# Patient Record
Sex: Female | Born: 1970 | Race: White | Hispanic: No | Marital: Married | Smoking: Current every day smoker
Health system: Southern US, Community
[De-identification: ages and names within clinical notes are randomized; demographics above are authoritative.]

## PROBLEM LIST (undated history)

## (undated) DIAGNOSIS — M5136 Other intervertebral disc degeneration, lumbar region: Secondary | ICD-10-CM

## (undated) DIAGNOSIS — M51369 Other intervertebral disc degeneration, lumbar region without mention of lumbar back pain or lower extremity pain: Secondary | ICD-10-CM

## (undated) DIAGNOSIS — M797 Fibromyalgia: Secondary | ICD-10-CM

## (undated) DIAGNOSIS — J449 Chronic obstructive pulmonary disease, unspecified: Secondary | ICD-10-CM

## (undated) DIAGNOSIS — E119 Type 2 diabetes mellitus without complications: Secondary | ICD-10-CM

## (undated) DIAGNOSIS — J45909 Unspecified asthma, uncomplicated: Secondary | ICD-10-CM

## (undated) DIAGNOSIS — M5126 Other intervertebral disc displacement, lumbar region: Secondary | ICD-10-CM

## (undated) DIAGNOSIS — G43909 Migraine, unspecified, not intractable, without status migrainosus: Secondary | ICD-10-CM

## (undated) DIAGNOSIS — E785 Hyperlipidemia, unspecified: Secondary | ICD-10-CM

## (undated) HISTORY — DX: Other intervertebral disc degeneration, lumbar region without mention of lumbar back pain or lower extremity pain: M51.369

## (undated) HISTORY — DX: Type 2 diabetes mellitus without complications: E11.9

## (undated) HISTORY — DX: Other intervertebral disc degeneration, lumbar region: M51.36

## (undated) HISTORY — DX: Hyperlipidemia, unspecified: E78.5

## (undated) HISTORY — DX: Other intervertebral disc displacement, lumbar region: M51.26

## (undated) HISTORY — DX: Chronic obstructive pulmonary disease, unspecified: J44.9

## (undated) HISTORY — DX: Migraine, unspecified, not intractable, without status migrainosus: G43.909

## (undated) HISTORY — DX: Fibromyalgia: M79.7

## (undated) HISTORY — PX: APPENDECTOMY: SHX54

## (undated) HISTORY — DX: Unspecified asthma, uncomplicated: J45.909

---

## 2008-03-22 ENCOUNTER — Ambulatory Visit (HOSPITAL_COMMUNITY): Admission: RE | Admit: 2008-03-22 | Discharge: 2008-03-22 | Payer: Self-pay | Admitting: Family Medicine

## 2008-05-06 ENCOUNTER — Ambulatory Visit (HOSPITAL_COMMUNITY): Admission: RE | Admit: 2008-05-06 | Discharge: 2008-05-06 | Payer: Self-pay | Admitting: Family Medicine

## 2008-07-28 ENCOUNTER — Ambulatory Visit (HOSPITAL_COMMUNITY): Admission: RE | Admit: 2008-07-28 | Discharge: 2008-07-28 | Payer: Self-pay | Admitting: Internal Medicine

## 2009-01-06 ENCOUNTER — Encounter
Admission: RE | Admit: 2009-01-06 | Discharge: 2009-04-06 | Payer: Self-pay | Admitting: Physical Medicine & Rehabilitation

## 2009-01-12 ENCOUNTER — Ambulatory Visit: Payer: Self-pay | Admitting: Physical Medicine & Rehabilitation

## 2009-02-06 ENCOUNTER — Ambulatory Visit: Payer: Self-pay | Admitting: Physical Medicine & Rehabilitation

## 2009-03-13 ENCOUNTER — Ambulatory Visit: Payer: Self-pay | Admitting: Physical Medicine & Rehabilitation

## 2010-03-22 ENCOUNTER — Other Ambulatory Visit: Admission: RE | Admit: 2010-03-22 | Discharge: 2010-03-22 | Payer: Self-pay | Admitting: Obstetrics and Gynecology

## 2010-03-26 ENCOUNTER — Ambulatory Visit (HOSPITAL_COMMUNITY): Admission: RE | Admit: 2010-03-26 | Discharge: 2010-03-26 | Payer: Self-pay | Admitting: Obstetrics and Gynecology

## 2010-05-15 ENCOUNTER — Ambulatory Visit (HOSPITAL_COMMUNITY): Admission: RE | Admit: 2010-05-15 | Discharge: 2010-05-15 | Payer: Self-pay | Admitting: Family Medicine

## 2010-08-05 ENCOUNTER — Encounter: Payer: Self-pay | Admitting: Family Medicine

## 2010-11-27 NOTE — Assessment & Plan Note (Signed)
The patient is a 40 year old female who originally had pain in her right  paraspinal thoracic area in July 2008.  She saw a chiropractor for this.  She was told that she tore a muscle and was treated with passive  modalities.  She improved and then worsened again in the fall of 2009.  She has had some increased pain since that time.  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  She had a thoracic spine MRI on July 28, 2008,  showing no significant diskogenic disease.  Paraspinal soft tissues were  normal.  No spinal cord abnormalities.  She was evaluated by  Neurosurgery over at [facility] and she was not felt to have any type of  surgical lesion.  I have also reviewed some lab work including a CBC  with diff, comprehensive metabolic panel and lipid profile, all of which  were fairly unremarkable.  TSH was also unremarkable.  She was sent  through some physical therapy.  At this time, it was more of an active  program doing some back extensor strengthening.   She has been treated with variety of medications, most recently  hydrocodone.  She states she takes this sparingly.  This was filled on  Dec 08, 2008, for q.i.d. and still has about 28 left.  She takes 3 a day  rather than 4 a day and also has Valium 5 mg 1-2 p.o. t.i.d., but this  was filled on May 5 and still has 2 left, so she really just taking this  once a day.   PAST SURGICAL HISTORY:  Positive for appendectomy.   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   Her average pain is 5/10, currently 7, described as sharp, constant,  aching.  Interferes with activity at 7/10, states she cannot play with  her kids much.  She can walk 50 minutes at times.  She climbs steps.  She drives.  Her pain is sharp, constant, aching.  She states she has  some numbness and tingling, but really this is very fleeting in her  proximal thighs rather than in  her legs.  She has had no incontinence,  but her bowel and bladder has had some diarrhea, as well as some nausea.  She complains of low energy.   SOCIAL HISTORY:  Married, smokes half pack per day.  No drug or alcohol  abuse in the past.   FAMILY HISTORY:  Diabetes, high blood pressure, psychiatric problems and  disability.   Her blood pressure is 107/68, pulse 83, respirations 18, and O2 sat 99%  on room air.  Overweight female in no acute stress.  Orientation x3.  Affect is alert.  She is anxious as well as emotionally labile, tends to  cry.  Her gait is normal.  Her extremities without edema.  Coordination  is normal in the upper and lower extremity.  Deep tendon reflexes are  normal in the upper and lower extremity.  Sensation is normal in the  upper and lower extremity.  Range of motion is normal in the upper and  lower extremity.  Palpation of her fibromyalgia tender points is only  positive in the low back area.  She has more focal tenderness around T8  and 9 area on the right approximately 2 to 3 cm lateral of the spinous  process.   Motor strength is full in the upper  and lower extremities.   IMPRESSION:  Mid back pain.  I have reviewed differential with the  patient.  Certainly, I do not think it is diskogenic.  Other  alternatives include thoracic facet syndrome and myofascial pain  syndrome, which is most likely.  She also may have some altered pain  sensitivity bearing in mind her history of temporomandibular joint.  She  is also quite emotionally labile with this and is concerned about the  impact on her overall functionality.   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  For this reason, I will start  her on Cymbalta 30 mg a  day.  Given her samples and a prescription.  She will have to come back  for trigger point injections and trial of  Lidoderm patch.  Discussed  with the patient, agrees with plan.      Erick Colace, M.D.  Electronically Signed     AEK/MedQ  D:  01/12/2009 14:52:32  T:  01/13/2009 05:35:40  Job #:  [id number]   cc:   Dr. Phillips Odor

## 2010-11-27 NOTE — Assessment & Plan Note (Signed)
A 40 year old female, right paraspinal pain which had onset in 2008,  seen by chiropractic, MRI showing no significant spine-related  disorders, no paraspinal soft tissue masses were noted, seen by  neurosurgery, not felt to have a surgical lesion.  She has done quite  well on Cymbalta 60 mg a day.  She has responded well to trigger-point  injection February 06, 2009.  She has been starting to exercise again.  She  is less emotional per her report.  Pain level 3/10 on average compared  to 6/10 prior.  Pain interferes with activity at 7/10 compared to 8  prior.  Sleep remains fair.  She can walk 20-30 minutes at a time.  She  climbs steps.  She drives.  She needs help with household duties and  shopping.   Her review of systems positive for weakness and spasms.   Blood pressure 107/65, pulse 87, respirations 18, O2 sat 98% on room  air.  General, no acute stress.  Mood and affect appropriate.  Back has  some tenderness to palpation in the lower thoracic and the entire lumbar  paraspinal muscle groups.  This is only on the right side.  She has good  range of motion of lumbar spine.  She has normal strength in the lower  extremities.  Normal tone.  Normal sensation in deep tendon reflex.   IMPRESSION:  Lumbar myofascial pain syndrome.  In a broader context,  fibromyalgia syndrome.   PLAN:  1. We will repeat trigger point injections today.  2. She is to continue Cymbalta  3. She is encouraged activity including aquatic exercise.      Erick Colace, M.D.  Electronically Signed     AEK/MedQ  D:  03/13/2009 16:25:57  T:  03/14/2009 05:41:39  Job #:  [id number]

## 2010-11-27 NOTE — Procedures (Signed)
NAME:  Barbara Barber, Barbara Barber              ACCOUNT NO.:  [id number]   MEDICAL RECORD NO.:  [id number]          PATIENT TYPE:  REC   LOCATION:  [facility]                          FACILITY:  [facility]   PHYSICIAN:  Erick Colace, M.D.DATE OF BIRTH:  1970-08-07   DATE OF PROCEDURE:  DATE OF DISCHARGE:                               OPERATIVE REPORT   Trigger point injection in right T12, L1, L3, and L5.   INDICATIONS:  Thoracolumbar myofascial pain syndrome.   Informed consent obtained after describing the risks and benefits of the  procedure with the patient.  These include bleeding, bruising,  infection.  She elects to proceed and has given written consent.  The  patient was placed prone on exam table.  Area marked, prepped with  Betadine and alcohol, entered with 25-gauge 1-1/2-inch needle, 1 mL of  1% lidocaine injected into each of 5 sites.  The patient tolerated the  procedure well.  Post-injection instruction was given.      Erick Colace, M.D.  Electronically Signed     AEK/MEDQ  D:  03/13/2009 16:08:20  T:  03/14/2009 04:48:52  Job:  [id number]

## 2010-11-27 NOTE — Procedures (Signed)
NAME:  Barbara Barber, COLTRIN NO.:  [id number]   MEDICAL RECORD NO.:  [id number]          PATIENT TYPE:  REC   LOCATION:  [facility]                          FACILITY:  [facility]   PHYSICIAN:  Erick Colace, M.D.DATE OF BIRTH:  1970-12-12   DATE OF PROCEDURE:  02/06/2009  DATE OF DISCHARGE:                               OPERATIVE REPORT   This is a trigger point injection right T11, T12-L1.   INDICATION:  Thoracolumbar myofascial pain syndrome.   Informed consent obtained after describing risks and benefits of the  procedure with the patient.  These include bleeding, bruising,  infection.  She elects to proceed and has given written consent.  The  patient placed prone on exam table.  Area marked and prepped with  Betadine alcohol entered with a 25-gauge inch and half needle, 1 mL of  1% lidocaine injected into each of 3 sites.  The patient tolerated the  procedure well.  Post injection instructions given.      Erick Colace, M.D.  Electronically Signed     AEK/MEDQ  D:  02/06/2009 15:42:21  T:  02/07/2009 04:19:16  Job:  [id number]

## 2014-01-20 ENCOUNTER — Other Ambulatory Visit: Payer: Self-pay | Admitting: Obstetrics and Gynecology

## 2014-01-27 ENCOUNTER — Ambulatory Visit (INDEPENDENT_AMBULATORY_CARE_PROVIDER_SITE_OTHER): Payer: BC Managed Care – PPO | Admitting: Obstetrics and Gynecology

## 2014-01-27 ENCOUNTER — Encounter: Payer: Self-pay | Admitting: Obstetrics and Gynecology

## 2014-01-27 ENCOUNTER — Other Ambulatory Visit (HOSPITAL_COMMUNITY)
Admission: RE | Admit: 2014-01-27 | Discharge: 2014-01-27 | Disposition: A | Discharge status: Home / Self Care | Payer: BC Managed Care – PPO | Source: Ambulatory Visit | Attending: Obstetrics and Gynecology | Admitting: Obstetrics and Gynecology

## 2014-01-27 VITALS — BP 108/62 | Ht 64.5 in | Wt 232.0 lb

## 2014-01-27 DIAGNOSIS — Z01419 Encounter for gynecological examination (general) (routine) without abnormal findings: Secondary | ICD-10-CM | POA: Insufficient documentation

## 2014-01-27 DIAGNOSIS — Z124 Encounter for screening for malignant neoplasm of cervix: Secondary | ICD-10-CM | POA: Insufficient documentation

## 2014-01-27 DIAGNOSIS — Z1151 Encounter for screening for human papillomavirus (HPV): Secondary | ICD-10-CM | POA: Insufficient documentation

## 2014-01-27 NOTE — Progress Notes (Signed)
This chart was scribed by Chestine SporeSoijett Blue , Medical Scribe, for Dr. Christin BachJohn Brooklyn Jeff on 01/27/14 at 3:50 PM. This chart was reviewed by Dr. Christin BachJohn Tomeko Scoville for accuracy.   Assessment:  Annual Gyn Exam   Plan:  1. pap smear done, next pap due in 3 years. 2. return annually or prn 3    Annual mammogram advised Subjective:  Barbara Barber is a 43 y.o. female No obstetric history on file. who presents for annual exam. Patient's last menstrual period was 01/21/2014. The patient has complaints today of heavy menstrual periods with associated pain to the back and right abdomen/right ovarian region. Cycle is q 28 days, lasting 5-8 days, with first 3 lite , 2-3 heavy, then lite. 1 box  X 48 tampon.  Pt states that she had a normal mammogram this January Dr. Emelda FearFerguson to know.   The following portions of the patient's history were reviewed and updated as appropriate: allergies, current medications, past family history, past medical history, past social history, past surgical history and problem list. Past Medical History  Diagnosis Date  . Fibromyalgia   . Diabetes mellitus without complication   . Hyperlipidemia   . Migraines     Past Surgical History  Procedure Laterality Date  . Appendectomy      Current outpatient prescriptions:DULoxetine (CYMBALTA) 60 MG capsule, Take 60 mg by mouth daily., Disp: , Rfl: ;  JANUMET 50-1000 MG per tablet, Take 1 tablet by mouth at bedtime. , Disp: , Rfl: ;  pravastatin (PRAVACHOL) 20 MG tablet, Take 10 mg by mouth daily. , Disp: , Rfl: ;  sitaGLIPtin-metformin (JANUMET) 50-500 MG per tablet, Take 1 tablet by mouth every morning., Disp: , Rfl: ;  tiZANidine (ZANAFLEX) 4 MG tablet, , Disp: , Rfl:   Review of Systems Constitutional: negative Gastrointestinal: negative, except for right lower abdominal pain Genitourinary: heavy menstrual periods   Objective:  BP 108/62  Ht 5' 4.5" (1.638 m)  Wt 232 lb (105.235 kg)  BMI 39.22 kg/m2  LMP 01/21/2014   BMI: Body  mass index is 39.22 kg/(m^2).  General Appearance: Alert, appropriate appearance for age. No acute distress HEENT: Grossly normal Neck / Thyroid:  Cardiovascular: RRR; normal S1, S2, no murmur Lungs: CTA bilaterally Back: No CVAT Breast Exam: not indicated  Gastrointestinal: Soft, non-tender, no masses or organomegaly Pelvic Exam: External genitalia: normal general appearance Vaginal: normal mucosa without prolapse or lesions, normal without tenderness, induration or masses and normal rugae Cervix: normal appearance Adnexa: normal bimanual exam Uterus: normal single, nontender and anteverted Rectovaginal: not indicated Lymphatic Exam: Non-palpable nodes in neck, clavicular, axillary, or inguinal regions Skin: no rash or abnormalities Neurologic: Normal gait and speech, no tremor  Psychiatric: Alert and oriented, appropriate affect.  Urinalysis:Not done  Christin BachJohn Hessie Varone. MD Pgr [phone] 3:53 PM

## 2014-01-27 NOTE — Patient Instructions (Signed)
Endometrial Ablation Endometrial ablation removes the lining of the uterus (endometrium). It is usually a same-day, outpatient treatment. Ablation helps avoid major surgery, such as surgery to remove the cervix and uterus (hysterectomy). After endometrial ablation, you will have little or no menstrual bleeding and may not be able to have children. However, if you are premenopausal, you will need to use a reliable method of birth control following the procedure because of the small chance that pregnancy can occur. There are different reasons to have this procedure, which include:  Heavy periods.  Bleeding that is causing anemia.  Irregular bleeding.  Bleeding fibroids on the lining inside the uterus if they are smaller than 3 centimeters. This procedure should not be done if:  You want children in the future.  You have severe cramps with your menstrual period.  You have precancerous or cancerous cells in your uterus.  You were recently pregnant.  You have gone through menopause.  You have had major surgery on the uterus, such as a cesarean delivery. LET YOUR HEALTH CARE PROVIDER KNOW ABOUT:  Any allergies you have.  All medicines you are taking, including vitamins, herbs, eye drops, creams, and over-the-counter medicines.  Previous problems you or members of your family have had with the use of anesthetics.  Any blood disorders you have.  Previous surgeries you have had.  Medical conditions you have. RISKS AND COMPLICATIONS  Generally, this is a safe procedure. However, as with any procedure, complications can occur. Possible complications include:  Perforation of the uterus.  Bleeding.  Infection of the uterus, bladder, or vagina.  Injury to surrounding organs.  An air bubble to the lung (air embolus).  Pregnancy following the procedure.  Failure of the procedure to help the problem, requiring hysterectomy.  Decreased ability to diagnose cancer in the lining of  the uterus. BEFORE THE PROCEDURE  The lining of the uterus must be tested to make sure there is no pre-cancerous or cancer cells present.  An ultrasound may be performed to look at the size of the uterus and to check for abnormalities.  Medicines may be given to thin the lining of the uterus. PROCEDURE  During the procedure, your health care provider will use a tool called a resectoscope to help see inside your uterus. There are different ways to remove the lining of your uterus.   Radiofrequency - This method uses a radiofrequency-alternating electric current to remove the lining of the uterus.  Cryotherapy - This method uses extreme cold to freeze the lining of the uterus.  Heated-Free Liquid - This method uses heated salt (saline) solution to remove the lining of the uterus.  Microwave - This method uses high-energy microwaves to heat up the lining of the uterus to remove it.  Thermal balloon - This method involves inserting a catheter with a balloon tip into the uterus. The balloon tip is filled with heated fluid to remove the lining of the uterus. AFTER THE PROCEDURE  After your procedure, do not have sexual intercourse or insert anything into your vagina until permitted by your health care provider. After the procedure, you may experience:  Cramps.  Vaginal discharge.  Frequent urination. Document Released: 05/10/2004 Document Revised: 03/03/2013 Document Reviewed: 12/02/2012 ExitCare Patient Information 2015 [employer]. This information is not intended to replace advice given to you by your health care provider. Make sure you discuss any questions you have with your health care provider.  

## 2014-01-31 LAB — CYTOLOGY - PAP

## 2014-02-09 ENCOUNTER — Telehealth: Payer: Self-pay | Admitting: Obstetrics and Gynecology

## 2014-02-10 NOTE — Telephone Encounter (Signed)
Pt aware of pap results.

## 2016-03-22 ENCOUNTER — Ambulatory Visit (INDEPENDENT_AMBULATORY_CARE_PROVIDER_SITE_OTHER): Payer: BC Managed Care – PPO | Admitting: Family Medicine

## 2016-03-22 ENCOUNTER — Encounter: Payer: Self-pay | Admitting: Family Medicine

## 2016-03-22 DIAGNOSIS — E119 Type 2 diabetes mellitus without complications: Secondary | ICD-10-CM

## 2016-03-22 DIAGNOSIS — M546 Pain in thoracic spine: Secondary | ICD-10-CM

## 2016-03-22 DIAGNOSIS — G8929 Other chronic pain: Secondary | ICD-10-CM

## 2016-03-22 DIAGNOSIS — M797 Fibromyalgia: Secondary | ICD-10-CM

## 2016-03-22 MED ORDER — GABAPENTIN 100 MG PO CAPS: 100.0000 mg | ORAL_CAPSULE | Freq: Three times a day (TID) | ORAL | 0 refills | Status: DC

## 2016-03-22 MED ORDER — SITAGLIPTIN PHOS-METFORMIN HCL 50-1000 MG PO TABS: 1.0000 | ORAL_TABLET | Freq: Two times a day (BID) | ORAL | 11 refills | Status: DC

## 2016-03-22 NOTE — Patient Instructions (Signed)
Great to meet you!  Lets follow up in one month to see how the medicine is helping.

## 2016-03-22 NOTE — Progress Notes (Signed)
   HPI  Patient presents today here to establish care with back pain.  Patient has a history of severe back injury years ago where she tore her large muscle in her back. She was treated with Vicodin, Valium, and Flexeril for a time and felt that it was too sedating so she stopped.  Recently she has had increased back pain is very frustrated with her current PCP because he has refused to give her narcotics for treatment. I have discussed that I agree with him that we will not plan on giving her chronic narcotics but if we need to can refer her to a pain management clinic.  She has fibromyalgia which is helped by Cymbalta. She describes her back pain is right-sided paraspinal thoracic back pain that's burning at times with radiation upward through her back and at times is dull and persistent. She has no leg symptoms, no leg weakness, and no bowel or bladder dysfunction.  She has type 2 diabetes She states her last A1c was either 6.7 or 7.1. She has moderate medication compliance, she gets sweats sometimes after taking Janumet. He has no documented hypoglycemia.   She is not exercising due to limitations of back pain  She is watching her diet currently  PMH: Smoking status noted Past medical history of bulging lumbar disc, diabetes, fibromyalgia, hyperlipidemia Family history positive for alcohol abuse in an uncle, heart disease in father ROS: Per HPI  Objective: BP 131/81   Pulse 96   Temp 97.1 F (36.2 C) (Oral)   Ht 5' 4.5" (1.638 m)   Wt 230 lb 9.6 oz (104.6 kg)   BMI 38.97 kg/m  Gen: NAD, alert, cooperative with exam HEENT: NCAT, TMs normal bilaterally CV: RRR, good S1/S2, no murmur Resp: CTABL, no wheezes, non-labored Abd: SNTND, BS present, no guarding or organomegaly Ext: No edema, warm Neuro: Alert and oriented, strength 5/5 and sensation intact in bilateral lower extremities, 2+ patellar tendon reflexes, negative straight leg raise  Musculoskeletal: Mild  tenderness to palpation of the right-sided thoracic paraspinal muscles, no midline tenderness  Assessment and plan:  # Chronic thoracic back pain Worsened lately Continue Cymbalta Adding gabapentin, 300 mg at night first to help her sleep, after 1 week added 100 mg dose in the morning and in the afternoon. Discussed directly that we are not planning to use chronic narcotic medications, however this is the only treatment that we can find is effective we will be glad to refer her to pain management.   # 2 diabetes Reports controlled A1c She would like to defer labs today Refill Janumet Consider SGLT-2 to avoid hypoglycemia  # Fibromyalgia Helped by Cymbalta, stable Continue, and adding gabapentin  # Morbid obesity Exercise limited by back pain Continue to monitor    Meds ordered this encounter  Medications  . gabapentin (NEURONTIN) 100 MG capsule    Sig: Take 1-3 capsules (100-300 mg total) by mouth 3 (three) times daily.    Dispense:  270 capsule    Refill:  0  . sitaGLIPtin-metformin (JANUMET) 50-1000 MG tablet    Sig: Take 1 tablet by mouth 2 (two) times daily with a meal.    Dispense:  60 tablet    Refill:  11    Murtis SinkSam Kemarion Abbey, MD [facility] 03/22/2016, 5:07 PM

## 2016-04-23 ENCOUNTER — Ambulatory Visit: Payer: BC Managed Care – PPO | Admitting: Family Medicine

## 2016-04-29 ENCOUNTER — Encounter: Payer: Self-pay | Admitting: Family Medicine

## 2016-04-29 ENCOUNTER — Ambulatory Visit (INDEPENDENT_AMBULATORY_CARE_PROVIDER_SITE_OTHER): Payer: BC Managed Care – PPO | Admitting: Family Medicine

## 2016-04-29 VITALS — BP 126/81 | HR 95 | Temp 97.1°F | Ht 64.5 in | Wt 230.2 lb

## 2016-04-29 DIAGNOSIS — G8929 Other chronic pain: Secondary | ICD-10-CM

## 2016-04-29 DIAGNOSIS — E119 Type 2 diabetes mellitus without complications: Secondary | ICD-10-CM

## 2016-04-29 DIAGNOSIS — M797 Fibromyalgia: Secondary | ICD-10-CM | POA: Diagnosis not present

## 2016-04-29 DIAGNOSIS — M546 Pain in thoracic spine: Secondary | ICD-10-CM | POA: Diagnosis not present

## 2016-04-29 DIAGNOSIS — J449 Chronic obstructive pulmonary disease, unspecified: Secondary | ICD-10-CM | POA: Diagnosis not present

## 2016-04-29 LAB — BAYER DCA HB A1C WAIVED: HB A1C (BAYER DCA - WAIVED): 8.2 % — ABNORMAL HIGH (ref <7.0)

## 2016-04-29 MED ORDER — GABAPENTIN 100 MG PO CAPS: ORAL_CAPSULE | ORAL | 5 refills | Status: DC

## 2016-04-29 MED ORDER — DULOXETINE HCL 60 MG PO CPEP: 60.0000 mg | ORAL_CAPSULE | Freq: Every day | ORAL | 3 refills | Status: DC

## 2016-04-29 NOTE — Patient Instructions (Signed)
Great to see you!  Lets see you again in 3 months  We will send your labs on mychart or call within 1 week

## 2016-04-29 NOTE — Progress Notes (Signed)
   HPI  Patient presents today here to follow-up for back pain and diabetes.  Patient has chronic right-sided thoracic back pain. Previously she was treated at pain management with narcotics. She has had some improvement with Cymbalta and last month we started gabapentin.  He said some improvement in pain, she's also had good improvement in sleep and nighttime pain. She's taking 100 mg in the afternoon on most days, and 300 mg at night.  fibromyalgia Managed well with Cymbalta  2 diabetes Last A1c was Januhis year, 7.5 that time. Good janumet adherence Watching her diet moderately,  cannot tolerate exercise Occasional fasting CBG is 150-200  PMH: Smoking status noted ROS: Per HPI  Objective: BP 126/81   Pulse 95   Temp 97.1 F (36.2 C) (Oral)   Ht 5' 4.5" (1.638 m)   Wt 230 lb 3.2 oz (104.4 kg)   BMI 38.90 kg/m  Gen: NAD, alert, cooperative with exam HEENT: NCAT, EOMI, PERRL CV: RRR, good S1/S2, no murmur Resp: CTABL, no wheezes, non-labored Abd: SNTND, BS present, no guarding or organomegaly Ext: No edema, warm Neuro: Alert and oriented, No gross deficits  Assessment and plan:  # T2DM Likely worsening A1C pending, hasnt been checked in 6 + months, Recently established here Discussed diet Discussed GLP which she is interested in [employer]  # chronic thoracic back pain Improved some on gabapentin Refilled Helping with night-time pain  # Fibromyalgia Stable on cymbalta ,needs refill.   COPD PFTs ordered, Hx without testing.  Hx of asthma, and heavy smoking.  No significant improvement with breo   Orders Placed This Encounter  Procedures  . Bayer DCA Hb A1c Waived  . CMP14+EGFR  . CBC with Differential/Platelet  . Lipid panel    Meds ordered this encounter  Medications  . gabapentin (NEURONTIN) 100 MG capsule    Sig: 1 capsule in afternoon and 3 at night    Dispense:  120 capsule    Refill:  5  . DULoxetine (CYMBALTA) 60 MG capsule   Sig: Take 1 capsule (60 mg total) by mouth daily.    Dispense:  90 capsule    Refill:  [place], MD [place] 04/29/2016, 4:41 PM

## 2016-04-30 LAB — CBC WITH DIFFERENTIAL/PLATELET
BASOS ABS: 0 10*3/uL (ref 0.0–0.2)
BASOS: 0 %
EOS (ABSOLUTE): 0.4 10*3/uL (ref 0.0–0.4)
EOS: 4 %
HEMOGLOBIN: 11.9 g/dL (ref 11.1–15.9)
Hematocrit: 35.9 % (ref 34.0–46.6)
IMMATURE GRANS (ABS): 0 10*3/uL (ref 0.0–0.1)
Immature Granulocytes: 0 %
LYMPHS: 27 %
Lymphocytes Absolute: 2.6 10*3/uL (ref 0.7–3.1)
MCH: 28.1 pg (ref 26.6–33.0)
MCHC: 33.1 g/dL (ref 31.5–35.7)
MCV: 85 fL (ref 79–97)
MONOCYTES: 5 %
Monocytes Absolute: 0.5 10*3/uL (ref 0.1–0.9)
NEUTROS ABS: 6.1 10*3/uL (ref 1.4–7.0)
Neutrophils: 64 %
Platelets: 312 10*3/uL (ref 150–379)
RBC: 4.23 x10E6/uL (ref 3.77–5.28)
RDW: 15.4 % (ref 12.3–15.4)
WBC: 9.7 10*3/uL (ref 3.4–10.8)

## 2016-04-30 LAB — CMP14+EGFR
ALT: 10 IU/L (ref 0–32)
AST: 9 IU/L (ref 0–40)
Albumin/Globulin Ratio: 1.4 (ref 1.2–2.2)
Albumin: 4.2 g/dL (ref 3.5–5.5)
Alkaline Phosphatase: 76 IU/L (ref 39–117)
BUN/Creatinine Ratio: 17 (ref 9–23)
BUN: 11 mg/dL (ref 6–24)
Bilirubin Total: <0.2 mg/dL (ref 0.0–1.2)
CALCIUM: 9.3 mg/dL (ref 8.7–10.2)
CO2: 22 mmol/L (ref 18–29)
CREATININE: 0.64 mg/dL (ref 0.57–1.00)
Chloride: 99 mmol/L (ref 96–106)
GFR, EST AFRICAN AMERICAN: 126 mL/min/{1.73_m2} (ref >59)
GFR, EST NON AFRICAN AMERICAN: 109 mL/min/{1.73_m2} (ref >59)
GLUCOSE: 150 mg/dL — AB (ref 65–99)
Globulin, Total: 2.9 g/dL (ref 1.5–4.5)
Potassium: 4.2 mmol/L (ref 3.5–5.2)
Sodium: 138 mmol/L (ref 134–144)
TOTAL PROTEIN: 7.1 g/dL (ref 6.0–8.5)

## 2016-04-30 LAB — LIPID PANEL
CHOLESTEROL TOTAL: 193 mg/dL (ref 100–199)
Chol/HDL Ratio: 4 ratio units (ref 0.0–4.4)
HDL: 48 mg/dL (ref >39)
LDL CALC: 116 mg/dL — AB (ref 0–99)
Triglycerides: 145 mg/dL (ref 0–149)
VLDL CHOLESTEROL CAL: 29 mg/dL (ref 5–40)

## 2016-05-08 ENCOUNTER — Telehealth: Payer: Self-pay | Admitting: Family Medicine

## 2016-05-08 NOTE — Telephone Encounter (Signed)
Left message for pt to call to schedule appt or can try OTC

## 2016-05-09 ENCOUNTER — Encounter: Payer: Self-pay | Admitting: Pharmacist

## 2016-05-09 ENCOUNTER — Ambulatory Visit (INDEPENDENT_AMBULATORY_CARE_PROVIDER_SITE_OTHER): Payer: BC Managed Care – PPO | Admitting: Pharmacist

## 2016-05-09 VITALS — BP 128/80 | HR 82 | Ht 65.0 in | Wt 227.0 lb

## 2016-05-09 DIAGNOSIS — E6609 Other obesity due to excess calories: Secondary | ICD-10-CM | POA: Diagnosis not present

## 2016-05-09 DIAGNOSIS — E1165 Type 2 diabetes mellitus with hyperglycemia: Secondary | ICD-10-CM

## 2016-05-09 DIAGNOSIS — Z6838 Body mass index (BMI) 38.0-38.9, adult: Secondary | ICD-10-CM | POA: Diagnosis not present

## 2016-05-09 DIAGNOSIS — IMO0001 Reserved for inherently not codable concepts without codable children: Secondary | ICD-10-CM

## 2016-05-09 MED ORDER — DULAGLUTIDE 0.75 MG/0.5ML ~~LOC~~ SOAJ: 0.7500 mg | SUBCUTANEOUS | 1 refills | Status: DC

## 2016-05-09 NOTE — Progress Notes (Signed)
Patient ID: Barbara Barber, female   DOB: [birth year], [age] y.o.   MRN: [name]   Subjective:    Barbara BellowsWindie M Barber is a 45 y.o. female who presents for an initial evaluation of Type 2 diabetes mellitus.  Initially diagnosed around 2009. Had gestational diabetes with both pregnancies.   Current symptoms of hypoglycemia:   Known diabetic complications: peripheral neuropathy Cardiovascular risk factors: diabetes mellitus, dyslipidemia, obesity (BMI >= 30 kg/m2) and sedentary lifestyle Current diabetic medications include Janumet 50/1000mg  bid.  Patient has taken Victoza about 2 years ago but stopped due to nausea.  She has discussed Trulicity with her PCP and would really like to try it.   Eye exam current (within one year): no - has an eye exam 08/2015 but she does not think they did diabetic eye exam Weight trend: decreasing steadily Prior visit with CDE: no Current diet: in general, a "healthy" diet   but only eats one meal a day. Current exercise: none - in pool during summer but no exercise currently due to back pain Medication Compliance?  Yes  Current monitoring regimen: home blood tests - 1 or 2 times daily Home blood sugar records: 200's Any episodes of hypoglycemia? no  Is She on ACE inhibitor or angiotensin II receptor blocker?  No - took lisinopril is past but caused ACE cough     The following portions of the patient's history were reviewed and updated as appropriate: allergies, current medications, past family history, past medical history, past social history, past surgical history and problem list.    Objective:    BP 128/80   Pulse 82   Ht 5\' 5"  (1.651 m)   Wt 227 lb (103 kg)   BMI 37.77 kg/m   Lab Review Glucose (mg/dL)  Date Value  40/98/119110/16/2017 150 (H)   CO2 (mmol/L)  Date Value  04/29/2016 22   BUN (mg/dL)  Date Value  47/82/956210/16/2017 11   Creatinine, Ser (mg/dL)  Date Value  13/08/657810/16/2017 0.64   A1c = 8.2%    Assessment:    Diabetes Mellitus type  II, under inadequate control.   Obesity - patient wants to lose weight for health.   Plan:    1.  Rx changes: add Trulicity 0.75mg  injection once weekly 2.  Education: Reviewed 'ABCs' of diabetes management (respective goals in parentheses):  A1C (<7), blood pressure (<130/80), and cholesterol (LDL <100). 3. Discussed complications of uncontrolled Dm and step to take to decrease complications.  Urine microalbumin checked today. 4. CHO counting diet discussed.  Reviewed CHO amount in various foods and how to read nutrition labels.  Discussed recommended serving sizes.  5.  Recommend check BG 2  times a day 6.  Recommended increase physical activity as able with careful attention not to worsen back issue.  Walking for just 10 minutes to start.  7. Follow up: 2 months

## 2016-05-09 NOTE — Patient Instructions (Signed)
Diabetes and Standards of Medical Care   Diabetes is complicated. You may find that your diabetes team includes a dietitian, nurse, diabetes educator, eye doctor, and more. To help everyone know what is going on and to help you get the care you deserve, the following schedule of care was developed to help keep you on track. Below are the tests, exams, vaccines, medicines, education, and plans you will need.  Blood Glucose Goals Prior to meals = 80 - 130 Within 2 hours of the start of a meal = less than 180  HbA1c test (goal is less than 7.0% - your last value was 8.2%) This test shows how well you have controlled your glucose over the past 2 to 3 months. It is used to see if your diabetes management plan needs to be adjusted.   It is performed at least 2 times a year if you are meeting treatment goals.  It is performed 4 times a year if therapy has changed or if you are not meeting treatment goals.  Blood pressure test  This test is performed at every routine medical visit. The goal is less than 140/90 mmHg for most people, but 130/80 mmHg in some cases. Ask your health care provider about your goal.  Dental exam  Follow up with the dentist regularly.  Eye exam  If you are diagnosed with type 1 diabetes as a child, get an exam upon reaching the age of 10 years or older and have had diabetes for 3 to 5 years. Yearly eye exams are recommended after that initial eye exam.  If you are diagnosed with type 1 diabetes as an adult, get an exam within 5 years of diagnosis and then yearly.  If you are diagnosed with type 2 diabetes, get an exam as soon as possible after the diagnosis and then yearly.  Foot care exam  Visual foot exams are performed at every routine medical visit. The exams check for cuts, injuries, or other problems with the feet.  A comprehensive foot exam should be done yearly. This includes visual inspection as well as assessing foot pulses and testing for loss of  sensation.  Check your feet nightly for cuts, injuries, or other problems with your feet. Tell your health care provider if anything is not healing.  Kidney function test (urine microalbumin)  This test is performed once a year.  Type 1 diabetes: The first test is performed 5 years after diagnosis.  Type 2 diabetes: The first test is performed at the time of diagnosis.  A serum creatinine and estimated glomerular filtration rate (eGFR) test is done once a year to assess the level of chronic kidney disease (CKD), if present.  Lipid profile (cholesterol, HDL, LDL, triglycerides)  Performed every 5 years for most people.  The goal for LDL is less than 100 mg/dL. If you are at high risk, the goal is less than 70 mg/dL.  The goal for HDL is 40 mg/dL to 50 mg/dL for men and 50 mg/dL to 60 mg/dL for women. An HDL cholesterol of 60 mg/dL or higher gives some protection against heart disease.  The goal for triglycerides is less than 150 mg/dL.  Influenza vaccine, pneumococcal vaccine, and hepatitis B vaccine  The influenza vaccine is recommended yearly.  The pneumococcal vaccine is generally given once in a lifetime. However, there are some instances when another vaccination is recommended. Check with your health care provider.  The hepatitis B vaccine is also recommended for adults with diabetes.    Diabetes self-management education  Education is recommended at diagnosis and ongoing as needed.  Treatment plan  Your treatment plan is reviewed at every medical visit.  Document Released: 04/28/2009 Document Revised: 03/03/2013 Document Reviewed: 12/01/2012 Exit[employer] Patient Information 2014 [employer].   

## 2016-05-10 LAB — MICROALBUMIN / CREATININE URINE RATIO
CREATININE, UR: 29.9 mg/dL
Microalbumin, Urine: <3 ug/mL

## 2016-05-13 ENCOUNTER — Encounter: Payer: Self-pay | Admitting: Pharmacist

## 2016-06-02 ENCOUNTER — Other Ambulatory Visit: Payer: Self-pay | Admitting: Family Medicine

## 2016-06-11 ENCOUNTER — Ambulatory Visit (INDEPENDENT_AMBULATORY_CARE_PROVIDER_SITE_OTHER): Payer: BC Managed Care – PPO | Admitting: Nurse Practitioner

## 2016-06-11 ENCOUNTER — Encounter: Payer: Self-pay | Admitting: Nurse Practitioner

## 2016-06-11 VITALS — BP 118/76 | HR 104 | Temp 97.6°F | Ht 65.0 in | Wt 225.0 lb

## 2016-06-11 DIAGNOSIS — J209 Acute bronchitis, unspecified: Secondary | ICD-10-CM

## 2016-06-11 MED ORDER — BENZONATATE 100 MG PO CAPS: 100.0000 mg | ORAL_CAPSULE | Freq: Three times a day (TID) | ORAL | 0 refills | Status: DC | PRN

## 2016-06-11 MED ORDER — METHYLPREDNISOLONE ACETATE 80 MG/ML IJ SUSP
80.0000 mg | Freq: Once | INTRAMUSCULAR | Status: AC
  Administered 2016-06-11: 80 mg via INTRAMUSCULAR

## 2016-06-11 MED ORDER — AZITHROMYCIN 500 MG PO TABS: ORAL_TABLET | ORAL | 0 refills | Status: DC

## 2016-06-11 NOTE — Patient Instructions (Signed)

## 2016-06-11 NOTE — Progress Notes (Signed)
Subjective:     Barbara Barber is a 45 y.o. female here for evaluation of a cough. Onset of symptoms was 4 days ago. Symptoms have been gradually worsening since that time. The cough is barky, harsh and nonproductive and is aggravated by exercise. Associated symptoms include: change in voice, shortness of breath and headache. Patient does not have a history of asthma. Patient does have a history of environmental allergens. Patient has not traveled recently. Patient does have a history of smoking. Patient has had a previous chest x-ray. Patient has had a PPD done.  The following portions of the patient's history were reviewed and updated as appropriate: allergies, current medications, past family history, past medical history, past social history, past surgical history and problem list.  Review of Systems Pertinent items are noted in HPI.    Objective:     BP 118/76   Pulse (!) 104   Temp 97.6 F (36.4 C) (Oral)   Ht 5\' 5"  (1.651 m)   Wt 225 lb (102.1 kg)   BMI 37.44 kg/m  General appearance: alert, cooperative and mild distress Eyes: conjunctivae/corneas clear. PERRL, EOM's intact. Fundi benign. Ears: normal TM's and external ear canals both ears Nose: clear discharge, moderate congestion, turbinates red, no sinus tenderness Throat: lips, mucosa, and tongue normal; teeth and gums normal Neck: no adenopathy, no carotid bruit, no JVD, supple, symmetrical, trachea midline and thyroid not enlarged, symmetric, no tenderness/mass/nodules Lungs: clear to auscultation bilaterally and deep dry cough- no wheezes rales or rhinchi Heart: regular rate and rhythm, S1, S2 normal, no murmur, click, rub or gallop    Assessment:    Acute Bronchitis    Plan:   1. Take meds as prescribed 2. Use a cool mist humidifier especially during the winter months and when heat has been humid. 3. Use saline nose sprays frequently 4. Saline irrigations of the nose can be very helpful if done frequently.  *  4X daily for 1 week*  * Use of a nettie pot can be helpful with this. Follow directions with this* 5. Drink plenty of fluids 6. Keep thermostat turn down low 7.For any cough or congestion  Use plain Mucinex- regular strength or max strength is fine   * Children- consult with Pharmacist for dosing 8. For fever or aces or pains- take tylenol or ibuprofen appropriate for age and weight.  * for fevers greater than 101 orally you may alternate ibuprofen and tylenol every  3 hours.   Meds ordered this encounter  Medications  . azithromycin (ZITHROMAX) 500 MG tablet    Sig: As directed    Dispense:  6 tablet    Refill:  0    Order Specific Question:   Supervising Provider    Answer:   VINCENT, CAROL L [[id number]]  . benzonatate (TESSALON) 100 MG capsule    Sig: Take 1 capsule (100 mg total) by mouth 3 (three) times daily as needed for cough.    Dispense:  20 capsule    Refill:  0    Order Specific Question:   Supervising Provider    Answer:   VINCENT, CAROL L [[id number]]  . methylPREDNISolone acetate (DEPO-MEDROL) injection 80 mg   Barbara Daphine DeutscherMartin, FNP

## 2016-06-19 ENCOUNTER — Telehealth: Payer: Self-pay | Admitting: Family Medicine

## 2016-06-19 NOTE — Telephone Encounter (Signed)
Patient called stating that she is not feeling better.  Patient states that she is still coughing and has some wheezing. Patient would like to have another antibiotic.

## 2016-06-19 NOTE — Telephone Encounter (Signed)
zithromax stays in system for 10 days- it will resolve- NTBS if no better

## 2016-06-20 NOTE — Telephone Encounter (Signed)
Advised pt of provider feedback and pt voiced understanding and if she doesn't get better she will call back to schedule an appt.

## 2016-07-20 ENCOUNTER — Other Ambulatory Visit: Payer: Self-pay | Admitting: Family Medicine

## 2016-07-29 ENCOUNTER — Ambulatory Visit: Payer: Self-pay | Admitting: Family Medicine

## 2016-08-01 ENCOUNTER — Ambulatory Visit: Payer: Self-pay | Admitting: Family Medicine

## 2016-09-13 ENCOUNTER — Ambulatory Visit (INDEPENDENT_AMBULATORY_CARE_PROVIDER_SITE_OTHER): Payer: BC Managed Care – PPO

## 2016-09-13 ENCOUNTER — Encounter: Payer: Self-pay | Admitting: Pediatrics

## 2016-09-13 ENCOUNTER — Ambulatory Visit (INDEPENDENT_AMBULATORY_CARE_PROVIDER_SITE_OTHER): Payer: BC Managed Care – PPO | Admitting: Pediatrics

## 2016-09-13 VITALS — BP 122/82 | HR 101 | Temp 97.1°F | Ht 65.0 in | Wt 227.4 lb

## 2016-09-13 DIAGNOSIS — M546 Pain in thoracic spine: Secondary | ICD-10-CM

## 2016-09-13 DIAGNOSIS — R0683 Snoring: Secondary | ICD-10-CM | POA: Diagnosis not present

## 2016-09-13 DIAGNOSIS — J4530 Mild persistent asthma, uncomplicated: Secondary | ICD-10-CM

## 2016-09-13 DIAGNOSIS — G4700 Insomnia, unspecified: Secondary | ICD-10-CM

## 2016-09-13 MED ORDER — ALBUTEROL SULFATE HFA 108 (90 BASE) MCG/ACT IN AERS: 2.0000 | INHALATION_SPRAY | Freq: Four times a day (QID) | RESPIRATORY_TRACT | 0 refills | Status: DC | PRN

## 2016-09-13 MED ORDER — CYCLOBENZAPRINE HCL 10 MG PO TABS: 10.0000 mg | ORAL_TABLET | Freq: Three times a day (TID) | ORAL | 1 refills | Status: DC | PRN

## 2016-09-13 NOTE — Patient Instructions (Addendum)

## 2016-09-13 NOTE — Progress Notes (Signed)
  Subjective:   Patient ID: Barbara Barber, female    DOB: 04/14/1971, 46 y.o.   MRN: [id number] CC: Back Pain (Mid to lower, worse for 3 days)  HPI: Barbara Barber is a 46 y.o. 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 Allergies and medications reviewed and updated. History  Smoking Status  . Current Every Day Smoker  . Packs/day: 0.50  . Years: 24.00  . Types: Cigarettes  Smokeless Tobacco  . Never Used   ROS: Per HPI   Objective:    BP 122/82   Pulse (!) 101   Temp 97.1 F (36.2 C) (Oral)   Ht 5\' 5"  (1.651 m)   Wt 227 lb 6.4 oz (103.1 kg)   BMI 37.84 kg/m   Wt Readings from Last 3 Encounters:  09/13/16 227 lb 6.4 oz (103.1 kg)  06/11/16 225 lb (102.1 kg)  05/09/16 227 lb (103 kg)    Gen: NAD, alert, cooperative with exam, NCAT EYES: EOMI, no conjunctival injection, or no icterus ENT:  TMs pearly gray b/l, OP without erythema LYMPH: no cervical LAD CV: NRRR, normal S1/S2, no murmur, distal pulses 2+ b/l Resp: CTABL, no wheezes, normal WOB Ext: No edema, warm Neuro: Alert and oriented MSK: mildly tender lower thoracic spine with palpation ttp along  midback R sided paraspinal muscles, some tightness in muscles R side compared with L side  Assessment & Plan:  Barbara Barber was seen today for back pain.  Diagnoses and all orders for this visit:  Acute midline thoracic back pain No compression fracture on xrays Will treat with muscle relaxant for muscle spasm in paraspinal muscles Start home exercises for back If ongoing will refer to PT -     DG Thoracic Spine 2 View; Future -     DG Lumbar Spine 2-3 Views; Future -     cyclobenzaprine (FLEXERIL) 10 MG tablet; Take 1 tablet (10 mg total) by mouth 3 (three) times daily as needed for muscle spasms.  Mild persistent asthma without complication Use albuteorl as needed No symptoms now, normal exam If using regularly needs to be seen -     albuterol (PROVENTIL HFA;VENTOLIN HFA) 108 (90 Base) MCG/ACT inhaler; Inhale 2 puffs into the lungs every 6 (six) hours as needed for wheezing or shortness of breath.  Snoring Insomnia, unspecified type Discussed sleep hygiene, recommended sleep study, pt declines  Follow up plan: Return in about 2 months (around 11/13/2016). Rex Krasarol Breane Grunwald, MD [facility]

## 2016-09-19 ENCOUNTER — Telehealth: Payer: Self-pay

## 2016-09-19 DIAGNOSIS — J4530 Mild persistent asthma, uncomplicated: Secondary | ICD-10-CM

## 2016-09-19 MED ORDER — ALBUTEROL SULFATE HFA 108 (90 BASE) MCG/ACT IN AERS: 2.0000 | INHALATION_SPRAY | Freq: Four times a day (QID) | RESPIRATORY_TRACT | 0 refills | Status: DC | PRN

## 2016-09-19 NOTE — Telephone Encounter (Signed)
Rx sent with additional notes that pro-air is preferred  Barbara SinkSam Robyne Matar, MD [facility] 09/19/2016, 12:00 PM

## 2016-09-19 NOTE — Telephone Encounter (Signed)
Patient aware.

## 2016-09-19 NOTE — Telephone Encounter (Signed)
Addressed in an earlier phon enote from today.   Barbara SinkSam [name], MD [facility] 09/19/2016, 3:49 PM

## 2016-09-22 ENCOUNTER — Other Ambulatory Visit: Payer: Self-pay | Admitting: Family Medicine

## 2016-11-21 ENCOUNTER — Other Ambulatory Visit: Payer: Self-pay | Admitting: Family Medicine

## 2016-11-21 ENCOUNTER — Other Ambulatory Visit: Payer: Self-pay | Admitting: *Deleted

## 2016-11-21 MED ORDER — GABAPENTIN 100 MG PO CAPS: ORAL_CAPSULE | ORAL | 2 refills | Status: DC

## 2016-12-02 ENCOUNTER — Other Ambulatory Visit: Payer: Self-pay | Admitting: Pediatrics

## 2016-12-02 ENCOUNTER — Other Ambulatory Visit: Payer: Self-pay | Admitting: Family Medicine

## 2016-12-02 DIAGNOSIS — M546 Pain in thoracic spine: Secondary | ICD-10-CM

## 2016-12-02 DIAGNOSIS — J4530 Mild persistent asthma, uncomplicated: Secondary | ICD-10-CM

## 2016-12-04 NOTE — Telephone Encounter (Signed)
Forward to PCP/[place]

## 2016-12-20 ENCOUNTER — Ambulatory Visit (INDEPENDENT_AMBULATORY_CARE_PROVIDER_SITE_OTHER): Payer: BC Managed Care – PPO | Admitting: Pediatrics

## 2016-12-20 ENCOUNTER — Encounter: Payer: Self-pay | Admitting: Pediatrics

## 2016-12-20 VITALS — BP 130/84 | HR 110 | Temp 97.9°F | Ht 65.0 in | Wt 227.0 lb

## 2016-12-20 DIAGNOSIS — M5441 Lumbago with sciatica, right side: Secondary | ICD-10-CM

## 2016-12-20 DIAGNOSIS — M5442 Lumbago with sciatica, left side: Secondary | ICD-10-CM

## 2016-12-20 DIAGNOSIS — R339 Retention of urine, unspecified: Secondary | ICD-10-CM | POA: Diagnosis not present

## 2016-12-20 MED ORDER — HYDROCODONE-ACETAMINOPHEN 5-325 MG PO TABS: 1.0000 | ORAL_TABLET | Freq: Four times a day (QID) | ORAL | 0 refills | Status: DC | PRN

## 2016-12-20 MED ORDER — CYCLOBENZAPRINE HCL 10 MG PO TABS: 10.0000 mg | ORAL_TABLET | Freq: Three times a day (TID) | ORAL | 1 refills | Status: DC | PRN

## 2016-12-20 MED ORDER — IBUPROFEN 800 MG PO TABS: 800.0000 mg | ORAL_TABLET | Freq: Three times a day (TID) | ORAL | 0 refills | Status: DC | PRN

## 2016-12-20 NOTE — Progress Notes (Signed)
Subjective:   Patient ID: [name], female    DOB: [birth year], 46 y.o.   MRN: [id number] CC: Back Pain (Radiating to hips and upper legs)  HPI: Barbara Barber is a 46 y.o. female presenting for Back Pain (Radiating to hips and upper legs)  Here today with daughter and adopted daughter  Micah Flesher to sit down yesterday morning, had terrible pain in lower back right before she sat down Has continued since then Took muscle relaxer, taking some ibuprofen 4 tabs q4-6 hrs Nothing has helped with pain Radiating to top of her buttocks, up her back, to upper thighs Tried to stand straight up this morning, fell to her knees No numbness in legs, anterior thighs tingling comes and goes Turning, twisting, sitting, back, lying down are all painful Sitting on edge of chair propped to one side is tolerable position  Had episode of back pain about 3 months ago Similar, but not hurting as much as now Feels like she is not emptying bladder completely Noticed it more since yesterday  Has had back problems off and on for years Past few weeks has had increasing lower back pain 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 Allergies and medications reviewed and updated. History  Smoking Status  . Current Every Day Smoker  . Packs/day: 0.50  . Years: 24.00  . Types: Cigarettes  Smokeless Tobacco  . Never Used   ROS: Per HPI   Objective:    BP 130/84   Pulse (!) 110   Temp 97.9 F (36.6 C) (Oral)   Ht 5\' 5"  (1.651 m)   Wt 227 lb (103 kg)   BMI 37.77 kg/m   Wt Readings from Last 3 Encounters:  12/20/16 227 lb (103 kg)  09/13/16 227 lb 6.4 oz (103.1 kg)  06/11/16 225 lb (102.1 kg)    Gen: NAD, alert, cooperative with exam, appears uncomfortable, sitting forward, propped up chair EYES: EOMI, no conjunctival injection, or no icterus CV: NRRR, normal S1/S2, no  murmur, distal pulses 2+ b/l Resp: CTABL, no wheezes, normal WOB Ext: No edema, warm Neuro: Alert and oriented, strength exam limited by pain, coordination grossly normal, patellar reflex 1+ b/l, sensation intact throughout lower legs, slow gait, bent slightly forward at waist, using both legs equally MSK:  ttp over spine upper lumbar region, ttp over R sided paraspinal muscles, limited mobility/ROM back due to pain, +SLR R leg  Assessment & Plan:  Kamrin was seen today for back pain.  Diagnoses and all orders for this visit:  Acute midline low back pain with bilateral sciatica Doesn't think always emptying bladder completely, going more often since re-injury Treat with below Will get MRI giving urinary retention, back pain has been present for weeks though now acutely worsened Gave #10 tabs lortab, must be seen for refills Gentle back stretches as tolerated -     cyclobenzaprine (FLEXERIL) 10 MG tablet; Take 1 tablet (10 mg total) by mouth 3 (three) times daily as needed. for muscle spams -     ibuprofen (ADVIL,MOTRIN) 800 MG tablet; Take 1 tablet (800 mg total) by mouth every 8 (eight) hours as needed. -     MR Lumbar Spine Wo Contrast; Future -     HYDROcodone-acetaminophen (NORCO/VICODIN) 5-325 MG tablet; Take 1 tablet by mouth every 6 (six) hours as needed for moderate pain.  Urinary retention -  MR Lumbar Spine Wo Contrast; Future   Follow up plan: Return in about 4 weeks (around 01/17/2017). any worsening in symptoms rtc, if urinary retention worsens must be seen Rex Krasarol Lilybelle Mayeda, MD [facility]

## 2017-01-06 ENCOUNTER — Ambulatory Visit (HOSPITAL_COMMUNITY): Payer: BC Managed Care – PPO | Attending: Pediatrics

## 2017-01-18 ENCOUNTER — Other Ambulatory Visit: Payer: Self-pay | Admitting: Family Medicine

## 2017-04-08 ENCOUNTER — Other Ambulatory Visit: Payer: Self-pay | Admitting: Family Medicine

## 2017-04-23 ENCOUNTER — Ambulatory Visit (INDEPENDENT_AMBULATORY_CARE_PROVIDER_SITE_OTHER): Payer: BC Managed Care – PPO | Admitting: Family Medicine

## 2017-04-23 VITALS — BP 139/76 | HR 103 | Temp 98.9°F | Ht 65.0 in

## 2017-04-23 DIAGNOSIS — M5442 Lumbago with sciatica, left side: Secondary | ICD-10-CM | POA: Diagnosis not present

## 2017-04-23 DIAGNOSIS — G8929 Other chronic pain: Secondary | ICD-10-CM | POA: Insufficient documentation

## 2017-04-23 DIAGNOSIS — M5441 Lumbago with sciatica, right side: Secondary | ICD-10-CM | POA: Diagnosis not present

## 2017-04-23 MED ORDER — PREDNISONE 10 MG PO TABS: ORAL_TABLET | ORAL | 0 refills | Status: DC

## 2017-04-23 MED ORDER — HYDROCODONE-ACETAMINOPHEN 5-325 MG PO TABS: 1.0000 | ORAL_TABLET | Freq: Four times a day (QID) | ORAL | 0 refills | Status: DC | PRN

## 2017-04-23 NOTE — Assessment & Plan Note (Signed)
Most certainly contributing to continued chronic low back pain. Weight loss was encouraged. I advised her to continue to follow-up her primary care provider regarding this.

## 2017-04-23 NOTE — Progress Notes (Signed)
Subjective: CC: Chronic low back pain with acute exacerbation PCP: Elenora Gamma, MD [id number] Barbara Barber is a 46 y.o. female who is accompanied by her husband today's visit. She is presenting to clinic today for:  1. Chronic low back pain with acute exacerbation Patient reports that she has been experiencing bilateral low back pain which radiates down to bilateral lower extremities for the past several days. She notes that on Saturday, she bent over and immediately felt shooting pain which prohibited her from standing straight. She reports that she has been taking Advil 800 mg every 4-6 hours, Flexeril every 8 hours with little improvement in her symptoms. She notes that pain has become progressively burning, she is feeling pain with sitting which radiates to bilateral hips and knees. She was seen in March and June of this year for similar symptoms. In June, the provider ordered an MRI of her back. She reports that she canceled the MRI because she was unable to make the appointments and then later did not reschedule because back pain seemingly gotten better. She has seen a chiropractor and a physical therapist in the past for this. She is also received back injections from a different provider several years ago. She reports that several years ago she also saw a back doctor who did not recommend surgical intervention. She denies preceding injury. She denies falls, numbness and tingling in the legs, saddle anesthesia, urinary retention or fecal incontinence.  Allergies  Allergen Reactions  . Ace Inhibitors Cough    lisinopril  . Other Hives    Talactin, (muscle relaxer), for TMJ given when pt was in [place]. High.- Hives, facial swelling CT dye, given the 90's caused throat swelling.    Past Medical History:  Diagnosis Date  . Bulging lumbar disc   . Diabetes mellitus without complication (HCC)   . Fibromyalgia   . Fibromyalgia   . Hyperlipidemia   . Migraines    Family History    Problem Relation Age of Onset  . Pelvic inflammatory disease Mother        had to have Hysterectomy at 51  . Ulcers Father   . Heart attack Father   . Stroke Father   . Lung disease Father   . Alcohol abuse Paternal Uncle   . Stroke Maternal Grandmother   . Heart disease Paternal Grandmother   . Diabetes Paternal Grandmother    Social Hx: daily smoker.Current medications reviewed.   ROS: Per HPI  Objective: Office vital signs reviewed. BP 139/76   Pulse (!) 103   Temp 98.9 F (37.2 C) (Oral)   Ht  (1.651 m)   Physical Examination:  General: Awake, alert, obese, appears uncomfortable Cardio: RRR, no murmurs Pulm: normal work of breathing on room air, no wheeze MSK: Antalgic gait, requiring the support of her husband for ambulation. Active range of motion severely reduced in all planes secondary to pain. No midline tenderness to palpation to the entire spine. She does have markedly paraspinal tenderness to palpation particularly on the right near the lumbosacral junction. No palpable bony abnormalities. Negative straight leg raise bilaterally. Neuro: Light touch sensation grossly intact throughout the lower extremities. Heel and toe walk was not performed secondary to pain with ambulation.  Assessment/ Plan: 46 y.o. female   Chronic bilateral low back pain with bilateral sciatica Patient with a long-standing history of chronic low back pain with bilateral sciatica. There are no red flags on history or exam. I suspect that her pain is likely  secondary to disc disease. She was supposed to have MRI performed but unfortunately has not had this done yet. A new order for MRI of lumbar spine has been placed. A referral to orthopedics is also been placed for further evaluation and consideration of possible surgical management. She was prescribed a prednisone Dosepak today. Instructions for use were reviewed. I also advised her that this may elevate her blood sugar and that if she were  to have any blood sugars over 500, she should contact her primary care provider. She is to continue with the muscle relaxer if this is helpful. I did counsel her on excessive use of NSAIDs. A short course of Norco 5 mg was provided today. I did review the [facility] narcotic database, which revealed the prescription that was provided to her by Dr. Oswaldo Done earlier this year, as well as 3 short prescriptions since that visit of Tylenol 3, which patient received for dental work. We did review that the short course of Norco would not be refillable and is not intended for her chronic management of her low back pain. I cautioned sedation and advised her not to operate heavy machinery or drive while taking medication. She voiced good understanding. Home care instructions were reviewed. She will follow up as needed with her primary care doctor.  Morbid obesity (HCC) Most certainly contributing to continued chronic low back pain. Weight loss was encouraged. I advised her to continue to follow-up her primary care provider regarding this.    Orders Placed This Encounter  Procedures  . MR Lumbar Spine Wo Contrast    Standing Status:   Future    Standing Expiration Date:   06/23/2018    Order Specific Question:   What is the patient's sedation requirement?    Answer:   No Sedation    Order Specific Question:   Does the patient have a pacemaker or implanted devices?    Answer:   No    Order Specific Question:   Preferred imaging location?    Answer:   [facility] (table limit-350lbs)    Order Specific Question:   Radiology Contrast Protocol - do NOT remove file path    Answer:   [email or web address]  . Ambulatory referral to Orthopedic Surgery    Referral Priority:   Routine    Referral Type:   Surgical    Referral Reason:   Specialty Services Required    Requested Specialty:   Orthopedic Surgery    Number of Visits Requested:   1   Meds ordered this encounter    Medications  . predniSONE (DELTASONE) 10 MG tablet    Sig: Take  by mouth day 1,  day 2,  day 3,  day 4,  day 5,  day 6.  Then stop.    Dispense:  21 tablet    Refill:  0  . HYDROcodone-acetaminophen (NORCO) 5-325 MG tablet    Sig: Take 1 tablet by mouth every 6 (six) hours as needed for moderate pain.    Dispense:  10 tablet    Refill:  0     Ashly Hulen Skains, DO [facility] [phone]

## 2017-04-23 NOTE — Assessment & Plan Note (Addendum)
Patient with a long-standing history of chronic low back pain with bilateral sciatica. There are no red flags on history or exam. I suspect that her pain is likely secondary to disc disease. She was supposed to have MRI performed but unfortunately has not had this done yet. A new order for MRI of lumbar spine has been placed. A referral to orthopedics is also been placed for further evaluation and consideration of possible surgical management. She was prescribed a prednisone Dosepak today. Instructions for use were reviewed. I also advised her that this may elevate her blood sugar and that if she were to have any blood sugars over 500, she should contact her primary care provider. She is to continue with the muscle relaxer if this is helpful. I did counsel her on excessive use of NSAIDs. A short course of Norco 5 mg was provided today. I did review the [facility] narcotic database, which revealed the prescription that was provided to her by Dr. Oswaldo Done earlier this year, as well as 3 short prescriptions since that visit of Tylenol 3, which patient received for dental work. We did review that the short course of Norco would not be refillable and is not intended for her chronic management of her low back pain. I cautioned sedation and advised her not to operate heavy machinery or drive while taking medication. She voiced good understanding. Home care instructions were reviewed. She will follow up as needed with her primary care doctor.

## 2017-04-23 NOTE — Patient Instructions (Addendum)
I have prescribed you prednisone to take for your back pain. He may continue your muscle relaxer a lot with his medication as prescribed. As we discussed, ibuprofen 800 mg more than every 8 hours is not recommended, as this can cause kidney injury. You do not have to take any ibuprofen or Aleve while taking the prednisone. You've also been prescribed a short supply of hydrocodone. As we discussed, this is not intended to be a long-term medication. This will not be available for refill. Take this sparingly as needed for breakthrough pain. Do not operate heavy machinery while on this medication. If you develop numbness or tingling in the groin area, difficulty urinating or fecal incontinence, please seek immediate medical attention. I have placed a referral to orthopedics for further evaluation. I will ask that you get the MRI of your back scheduled as soon as possible.   Back Pain, Adult Back pain is very common in adults.The cause of back pain is rarely dangerous and the pain often gets better over time.The cause of your back pain may not be known. Some common causes of back pain include:  Strain of the muscles or ligaments supporting the spine.  Wear and tear (degeneration) of the spinal disks.  Arthritis.  Direct injury to the back.  For many people, back pain may return. Since back pain is rarely dangerous, most people can learn to manage this condition on their own. Follow these instructions at home: Watch your back pain for any changes. The following actions may help to lessen any discomfort you are feeling:  Remain active. It is stressful on your back to sit or stand in one place for long periods of time. Do not sit, drive, or stand in one place for more than 30 minutes at a time. Take short walks on even surfaces as soon as you are able.Try to increase the length of time you walk each day.  Exercise regularly as directed by your health care provider. Exercise helps your back heal  faster. It also helps avoid future injury by keeping your muscles strong and flexible.  Do not stay in bed.Resting more than 1-2 days can delay your recovery.  Pay attention to your body when you bend and lift. The most comfortable positions are those that put less stress on your recovering back. Always use proper lifting techniques, including: ? Bending your knees. ? Keeping the load close to your body. ? Avoiding twisting.  Find a comfortable position to sleep. Use a firm mattress and lie on your side with your knees slightly bent. If you lie on your back, put a pillow under your knees.  Avoid feeling anxious or stressed.Stress increases muscle tension and can worsen back pain.It is important to recognize when you are anxious or stressed and learn ways to manage it, such as with exercise.  Take medicines only as directed by your health care provider. Over-the-counter medicines to reduce pain and inflammation are often the most helpful.Your health care provider may prescribe muscle relaxant drugs.These medicines help dull your pain so you can more quickly return to your normal activities and healthy exercise.  Apply ice to the injured area: ? Put ice in a plastic bag. ? Place a towel between your skin and the bag. ? Leave the ice on for 20 minutes, 2-3 times a day for the first 2-3 days. After that, ice and heat may be alternated to reduce pain and spasms.  Maintain a healthy weight. Excess weight puts extra stress on your  back and makes it difficult to maintain good posture.  Contact a health care provider if:  You have pain that is not relieved with rest or medicine.  You have increasing pain going down into the legs or buttocks.  You have pain that does not improve in one week.  You have night pain.  You lose weight.  You have a fever or chills. Get help right away if:  You develop new bowel or bladder control problems.  You have unusual weakness or numbness in your  arms or legs.  You develop nausea or vomiting.  You develop abdominal pain.  You feel faint. This information is not intended to replace advice given to you by your health care provider. Make sure you discuss any questions you have with your health care provider. Document Released: 07/01/2005 Document Revised: 11/09/2015 Document Reviewed: 11/02/2013 Elsevier Interactive Patient Education  2017 [employer].

## 2017-05-06 ENCOUNTER — Ambulatory Visit (HOSPITAL_COMMUNITY): Payer: BC Managed Care – PPO

## 2017-05-08 ENCOUNTER — Ambulatory Visit (INDEPENDENT_AMBULATORY_CARE_PROVIDER_SITE_OTHER): Payer: BC Managed Care – PPO | Admitting: Orthopaedic Surgery

## 2017-05-13 ENCOUNTER — Encounter (HOSPITAL_COMMUNITY): Payer: Self-pay

## 2017-05-13 ENCOUNTER — Other Ambulatory Visit (HOSPITAL_COMMUNITY): Payer: BC Managed Care – PPO

## 2017-05-13 ENCOUNTER — Ambulatory Visit (HOSPITAL_COMMUNITY)
Admission: RE | Admit: 2017-05-13 | Discharge: 2017-05-13 | Disposition: A | Discharge status: Home / Self Care | Payer: BC Managed Care – PPO | Source: Ambulatory Visit | Attending: Family Medicine | Admitting: Family Medicine

## 2017-05-13 DIAGNOSIS — M5441 Lumbago with sciatica, right side: Principal | ICD-10-CM

## 2017-05-13 DIAGNOSIS — M5442 Lumbago with sciatica, left side: Principal | ICD-10-CM

## 2017-05-13 DIAGNOSIS — G8929 Other chronic pain: Secondary | ICD-10-CM

## 2017-05-14 ENCOUNTER — Other Ambulatory Visit: Payer: Self-pay | Admitting: Family Medicine

## 2017-05-14 ENCOUNTER — Other Ambulatory Visit: Payer: Self-pay | Admitting: Pediatrics

## 2017-05-14 DIAGNOSIS — M5442 Lumbago with sciatica, left side: Principal | ICD-10-CM

## 2017-05-14 DIAGNOSIS — J4530 Mild persistent asthma, uncomplicated: Secondary | ICD-10-CM

## 2017-05-14 DIAGNOSIS — M5441 Lumbago with sciatica, right side: Secondary | ICD-10-CM

## 2017-05-15 ENCOUNTER — Ambulatory Visit (INDEPENDENT_AMBULATORY_CARE_PROVIDER_SITE_OTHER): Payer: BC Managed Care – PPO | Admitting: Orthopaedic Surgery

## 2017-05-26 ENCOUNTER — Telehealth: Payer: Self-pay | Admitting: Family Medicine

## 2017-05-29 ENCOUNTER — Ambulatory Visit (INDEPENDENT_AMBULATORY_CARE_PROVIDER_SITE_OTHER): Payer: BC Managed Care – PPO | Admitting: Orthopaedic Surgery

## 2017-07-12 ENCOUNTER — Other Ambulatory Visit: Payer: Self-pay | Admitting: Family Medicine

## 2017-08-20 ENCOUNTER — Other Ambulatory Visit: Payer: Self-pay | Admitting: Family Medicine

## 2017-08-24 ENCOUNTER — Other Ambulatory Visit: Payer: Self-pay | Admitting: Family Medicine

## 2017-08-24 DIAGNOSIS — M5442 Lumbago with sciatica, left side: Principal | ICD-10-CM

## 2017-08-24 DIAGNOSIS — M5441 Lumbago with sciatica, right side: Secondary | ICD-10-CM

## 2017-08-25 NOTE — Telephone Encounter (Signed)
Last seen 04/23/17  Dr Reece AgarG   Dr Ermalinda MemosBradshaw PCP

## 2017-09-13 ENCOUNTER — Other Ambulatory Visit: Payer: Self-pay | Admitting: Family Medicine

## 2017-09-13 DIAGNOSIS — J4530 Mild persistent asthma, uncomplicated: Secondary | ICD-10-CM

## 2017-09-30 ENCOUNTER — Other Ambulatory Visit: Payer: Self-pay | Admitting: Family Medicine

## 2017-11-09 ENCOUNTER — Other Ambulatory Visit: Payer: Self-pay | Admitting: Family Medicine

## 2017-11-09 DIAGNOSIS — M5442 Lumbago with sciatica, left side: Principal | ICD-10-CM

## 2017-11-09 DIAGNOSIS — M5441 Lumbago with sciatica, right side: Secondary | ICD-10-CM

## 2017-11-10 NOTE — Telephone Encounter (Signed)
Last seen 04/23/17

## 2017-11-11 ENCOUNTER — Ambulatory Visit: Payer: BC Managed Care – PPO | Admitting: Family Medicine

## 2017-11-11 ENCOUNTER — Encounter: Payer: Self-pay | Admitting: Family Medicine

## 2017-11-11 VITALS — BP 118/82 | HR 96 | Temp 97.8°F | Ht 65.0 in | Wt 227.0 lb

## 2017-11-11 DIAGNOSIS — M10071 Idiopathic gout, right ankle and foot: Secondary | ICD-10-CM

## 2017-11-11 MED ORDER — COLCHICINE 0.6 MG PO TABS: ORAL_TABLET | ORAL | 2 refills | Status: DC

## 2017-11-11 NOTE — Progress Notes (Signed)
Subjective:  Patient ID: Barbara Barber, female    DOB: 01-23-1971  Age: 47 y.o. MRN: [id number]  CC: pain at joint near right great toe   HPI Barbara Barber presents for onset 2 days ago of sudden pain in the right foot.  It is based at the first right MTP joint.  It is so severe she can hardly let anything touch it.  She cannot put weight on it to walk she has to walk on the side of her foot.  She cannot wear most of her shoes.  She specifically chose a pair of sandals that is somewhat less painful.  It is hard for her to let the sheet touch it at night.  She had a blood clot many years ago and is concerned that that might be back.  Her husband has a history of gout and he says it similar to what he has had before.  Depression screen [facility] 2/9 11/11/2017 04/23/2017 12/20/2016  Decreased Interest 0 0 0  Down, Depressed, Hopeless 0 0 0  PHQ - 2 Score 0 0 0    History Barbara Barber has a past medical history of Bulging lumbar disc, Diabetes mellitus without complication ([place]), Fibromyalgia, Fibromyalgia, Hyperlipidemia, and Migraines.   She has a past surgical history that includes Appendectomy.   Her family history includes Alcohol abuse in her paternal uncle; Diabetes in her paternal grandmother; Heart attack in her father; Heart disease in her paternal grandmother; Lung disease in her father; Pelvic inflammatory disease in her mother; Stroke in her father and maternal grandmother; Ulcers in her father.She reports that she has been smoking cigarettes.  She has a 12.00 pack-year smoking history. She has never used smokeless tobacco. She reports that she does not drink alcohol or use drugs.    ROS Review of Systems  Constitutional: Positive for activity change. Negative for appetite change, chills, diaphoresis and fever.  HENT: Negative.   Respiratory: Negative for cough and shortness of breath.   Cardiovascular: Negative for chest pain.  Gastrointestinal: Negative for abdominal pain.    Musculoskeletal: Positive for arthralgias.  Neurological: Negative for dizziness.    Objective:  BP 118/82   Pulse 96   Temp 97.8 F (36.6 C) (Oral)   Ht _0  (1.651 m)   Wt 227 lb (103 kg)   BMI 37.77 kg/m   BP Readings from Last 3 Encounters:  11/11/17 118/82  04/23/17 139/76  12/20/16 130/84    Wt Readings from Last 3 Encounters:  11/11/17 227 lb (103 kg)  12/20/16 227 lb (103 kg)  09/13/16 227 lb 6.4 oz (103.1 kg)     Physical Exam  Constitutional: She is oriented to person, place, and time. She appears well-developed and well-nourished. She appears distressed.  HENT:  Head: Normocephalic and atraumatic.  Eyes: Pupils are equal, round, and reactive to light. EOM are normal.  Musculoskeletal: Normal range of motion. She exhibits edema (This is mild at the right forefoot) and tenderness (There is exquisite tenderness to light percussion of the first right MTP). She exhibits no deformity.  Neurological: She is alert and oriented to person, place, and time.  Skin: Skin is warm and dry. There is erythema (This is noted to be isolated into the area of the right first MTP and the immediately surrounding tissues for about 5 cm laterally and proximally.).  Psychiatric: She has a normal mood and affect. Her behavior is normal. Thought content normal.      Assessment & Plan:  Barbara Barber was seen today for pain at joint near right great toe.  Diagnoses and all orders for this visit:  Acute idiopathic gout involving toe of right foot -     CBC with Differential/Platelet -     CMP14+EGFR -     Uric acid -     Sedimentation rate  Other orders -     colchicine 0.6 MG tablet; Take twice daily for gout attack. (may take every two hours up to 6 doses at acute onset)       I have discontinued Barbara Barber's predniSONE and HYDROcodone-acetaminophen. I am also having her start on colchicine. Additionally, I am having her maintain her diphenhydrAMINE, JANUMET,  DULoxetine, TRULICITY, albuterol, gabapentin, and cyclobenzaprine.  Allergies as of 11/11/2017      Reactions   Ace Inhibitors Cough   lisinopril   Other Hives   Talactin, (muscle relaxer), for TMJ given when pt was in [place]. High.- Hives, facial swelling CT dye, given the 90's caused throat swelling.      Medication List        Accurate as of 11/11/17  8:09 PM. Always use your most recent med list.          albuterol 108 (90 Base) MCG/ACT inhaler Commonly known as:  PROVENTIL HFA;VENTOLIN HFA INHALE 2 PUFFS BY MOUTH EVERY 6 HOURS AS NEEDED FOR  WHEEZING  OR  SHORTNESS  OF  BREATH   colchicine 0.6 MG tablet Take twice daily for gout attack. (may take every two hours up to 6 doses at acute onset)   cyclobenzaprine 10 MG tablet Commonly known as:  FLEXERIL TAKE 1 TABLET BY MOUTH THREE TIMES DAILY AS NEEDED FOR MUSCLE SPASMS   diphenhydrAMINE 25 mg capsule Commonly known as:  BENADRYL Take 50 mg by mouth at bedtime as needed.   DULoxetine 60 MG capsule Commonly known as:  CYMBALTA TAKE ONE CAPSULE BY MOUTH ONCE DAILY   gabapentin 100 MG capsule Commonly known as:  NEURONTIN TAKE 1 CAPSULE BY MOUTH IN THE AFTERNOON AND 3 CAPSULES AT BEDTIME   JANUMET 50-1000 MG tablet Generic drug:  sitaGLIPtin-metformin TAKE ONE TABLET BY MOUTH TWICE DAILY WITH A MEAL   TRULICITY 7.94 FE/7.6DY Sopn Generic drug:  Dulaglutide INJECT 1 SYRINGE SUBCUTANEOUSLY ONCE A WEEK        Follow-up: Return in about 2 weeks (around 11/25/2017), or gout, with Dr. Wendi Snipes.  Claretta Fraise, M.D.

## 2017-11-12 ENCOUNTER — Other Ambulatory Visit: Payer: BC Managed Care – PPO

## 2017-11-13 LAB — CBC WITH DIFFERENTIAL/PLATELET
BASOS: 0 %
Basophils Absolute: 0 10*3/uL (ref 0.0–0.2)
EOS (ABSOLUTE): 0.3 10*3/uL (ref 0.0–0.4)
EOS: 3 %
HEMATOCRIT: 38.1 % (ref 34.0–46.6)
HEMOGLOBIN: 12.9 g/dL (ref 11.1–15.9)
Immature Grans (Abs): 0 10*3/uL (ref 0.0–0.1)
Immature Granulocytes: 0 %
LYMPHS ABS: 1.7 10*3/uL (ref 0.7–3.1)
Lymphs: 19 %
MCH: 30.1 pg (ref 26.6–33.0)
MCHC: 33.9 g/dL (ref 31.5–35.7)
MCV: 89 fL (ref 79–97)
MONOCYTES: 5 %
Monocytes Absolute: 0.5 10*3/uL (ref 0.1–0.9)
NEUTROS ABS: 6.2 10*3/uL (ref 1.4–7.0)
Neutrophils: 73 %
Platelets: 252 10*3/uL (ref 150–379)
RBC: 4.29 x10E6/uL (ref 3.77–5.28)
RDW: 15.2 % (ref 12.3–15.4)
WBC: 8.6 10*3/uL (ref 3.4–10.8)

## 2017-11-13 LAB — CMP14+EGFR
A/G RATIO: 1.7 (ref 1.2–2.2)
ALBUMIN: 4.1 g/dL (ref 3.5–5.5)
ALK PHOS: 82 IU/L (ref 39–117)
ALT: 17 IU/L (ref 0–32)
AST: 13 IU/L (ref 0–40)
BUN / CREAT RATIO: 14 (ref 9–23)
BUN: 10 mg/dL (ref 6–24)
Bilirubin Total: 0.2 mg/dL (ref 0.0–1.2)
CO2: 20 mmol/L (ref 20–29)
CREATININE: 0.72 mg/dL (ref 0.57–1.00)
Calcium: 9.3 mg/dL (ref 8.7–10.2)
Chloride: 98 mmol/L (ref 96–106)
GFR calc Af Amer: 116 mL/min/{1.73_m2} (ref >59)
GFR calc non Af Amer: 101 mL/min/{1.73_m2} (ref >59)
GLOBULIN, TOTAL: 2.4 g/dL (ref 1.5–4.5)
Glucose: 276 mg/dL — ABNORMAL HIGH (ref 65–99)
POTASSIUM: 4.6 mmol/L (ref 3.5–5.2)
SODIUM: 135 mmol/L (ref 134–144)
Total Protein: 6.5 g/dL (ref 6.0–8.5)

## 2017-11-13 LAB — SEDIMENTATION RATE: SED RATE: 26 mm/h (ref 0–32)

## 2017-11-13 LAB — URIC ACID: URIC ACID: 4.1 mg/dL (ref 2.5–7.1)

## 2017-11-25 ENCOUNTER — Ambulatory Visit: Payer: BC Managed Care – PPO | Admitting: Family Medicine

## 2017-11-26 ENCOUNTER — Encounter: Payer: Self-pay | Admitting: Family Medicine

## 2017-11-30 ENCOUNTER — Other Ambulatory Visit: Payer: Self-pay | Admitting: Family Medicine

## 2017-11-30 DIAGNOSIS — J4530 Mild persistent asthma, uncomplicated: Secondary | ICD-10-CM

## 2017-12-04 ENCOUNTER — Other Ambulatory Visit: Payer: Self-pay | Admitting: Family Medicine

## 2017-12-07 ENCOUNTER — Other Ambulatory Visit: Payer: Self-pay | Admitting: Family Medicine

## 2017-12-16 ENCOUNTER — Other Ambulatory Visit: Payer: Self-pay | Admitting: Family Medicine

## 2017-12-16 DIAGNOSIS — M5441 Lumbago with sciatica, right side: Secondary | ICD-10-CM

## 2017-12-16 DIAGNOSIS — M5442 Lumbago with sciatica, left side: Principal | ICD-10-CM

## 2018-01-27 ENCOUNTER — Other Ambulatory Visit: Payer: Self-pay | Admitting: Family Medicine

## 2018-01-27 DIAGNOSIS — M5442 Lumbago with sciatica, left side: Secondary | ICD-10-CM

## 2018-01-27 DIAGNOSIS — J4530 Mild persistent asthma, uncomplicated: Secondary | ICD-10-CM

## 2018-01-27 DIAGNOSIS — M5441 Lumbago with sciatica, right side: Secondary | ICD-10-CM

## 2018-01-28 NOTE — Telephone Encounter (Signed)
Last seen 11/11/17  Dr Bradshaw 

## 2018-03-24 ENCOUNTER — Encounter: Payer: Self-pay | Admitting: Family Medicine

## 2018-03-24 ENCOUNTER — Ambulatory Visit: Payer: BC Managed Care – PPO | Admitting: Family Medicine

## 2018-03-24 VITALS — BP 115/84 | HR 105 | Temp 97.3°F | Ht 65.0 in | Wt 218.1 lb

## 2018-03-24 DIAGNOSIS — M5441 Lumbago with sciatica, right side: Secondary | ICD-10-CM

## 2018-03-24 DIAGNOSIS — E119 Type 2 diabetes mellitus without complications: Secondary | ICD-10-CM

## 2018-03-24 DIAGNOSIS — M5442 Lumbago with sciatica, left side: Secondary | ICD-10-CM

## 2018-03-24 LAB — BAYER DCA HB A1C WAIVED: HB A1C (BAYER DCA - WAIVED): 9.9 % — ABNORMAL HIGH (ref <7.0)

## 2018-03-24 MED ORDER — DULOXETINE HCL 60 MG PO CPEP: 60.0000 mg | ORAL_CAPSULE | Freq: Every day | ORAL | 1 refills | Status: DC

## 2018-03-24 MED ORDER — COLCHICINE 0.6 MG PO TABS: ORAL_TABLET | ORAL | 2 refills | Status: DC

## 2018-03-24 MED ORDER — FEXOFENADINE-PSEUDOEPHED ER 180-240 MG PO TB24: 1.0000 | ORAL_TABLET | Freq: Every day | ORAL | 1 refills | Status: DC

## 2018-03-24 MED ORDER — FLUTICASONE FUROATE-VILANTEROL 100-25 MCG/INH IN AEPB: 1.0000 | INHALATION_SPRAY | Freq: Every day | RESPIRATORY_TRACT | 5 refills | Status: DC

## 2018-03-24 MED ORDER — PREGABALIN 75 MG PO CAPS: ORAL_CAPSULE | ORAL | 2 refills | Status: DC

## 2018-03-24 MED ORDER — SITAGLIPTIN PHOS-METFORMIN HCL 50-1000 MG PO TABS: 1.0000 | ORAL_TABLET | Freq: Two times a day (BID) | ORAL | 11 refills | Status: DC

## 2018-03-24 MED ORDER — DULAGLUTIDE 0.75 MG/0.5ML ~~LOC~~ SOAJ: SUBCUTANEOUS | 1 refills | Status: DC

## 2018-03-24 MED ORDER — GABAPENTIN 100 MG PO CAPS: ORAL_CAPSULE | ORAL | 5 refills | Status: DC

## 2018-03-24 MED ORDER — CYCLOBENZAPRINE HCL 10 MG PO TABS: 10.0000 mg | ORAL_TABLET | Freq: Three times a day (TID) | ORAL | 2 refills | Status: DC | PRN

## 2018-03-24 NOTE — Progress Notes (Signed)
Subjective:  Patient ID: Barbara Barber, female    DOB: 03/30/1971  Age: 47 y.o. MRN: [id number]  CC: Medical Management of Chronic Issues   HPI Barbara Barber presents forFollow-up of diabetes. Patient checks blood sugar rarely at home.  Monitor broke months ago. Patient denies symptoms such as polyuria, polydipsia, excessive hunger, nausea No significant hypoglycemic spells noted. Medications reviewed. Pt reports taking them regularly without complication/adverse reaction being reported today.  Checking feet daily.Beginning to get some tingling. Concerned for neuropathy.    History Barbara Barber has a past medical history of Bulging lumbar disc, Diabetes mellitus without complication ([place]), Fibromyalgia, Fibromyalgia, Hyperlipidemia, Lumbar herniated disc, and Migraines.   She has a past surgical history that includes Appendectomy.   Her family history includes Alcohol abuse in her paternal uncle; Diabetes in her paternal grandmother; Heart attack in her father; Heart disease in her paternal grandmother; Lung disease in her father; Pelvic inflammatory disease in her mother; Stroke in her father and maternal grandmother; Ulcers in her father.She reports that she has been smoking cigarettes. She has a 12.00 pack-year smoking history. She has never used smokeless tobacco. She reports that she does not drink alcohol or use drugs.  Current Outpatient Medications on File Prior to Visit  Medication Sig Dispense Refill  . diphenhydrAMINE (BENADRYL) 25 mg capsule Take 50 mg by mouth at bedtime as needed.    Marland Kitchen PROAIR HFA 108 (90 Base) MCG/ACT inhaler INHALE 2 PUFFS BY MOUTH EVERY 6 HOURS AS NEEDED FOR WHEEZING FOR SHORTNESS OF BREATH 18 g 3   No current facility-administered medications on file prior to visit.     ROS Review of Systems  Constitutional: Negative.   HENT: Positive for congestion and postnasal drip.   Eyes: Negative for visual disturbance.  Respiratory: Negative for shortness  of breath.   Cardiovascular: Negative for chest pain.  Gastrointestinal: Negative for abdominal pain, constipation, diarrhea, nausea and vomiting.  Genitourinary: Negative for difficulty urinating.  Musculoskeletal: Positive for arthralgias and myalgias.  Neurological: Negative for headaches.  Psychiatric/Behavioral: Negative for sleep disturbance.    Objective:  BP 115/84   Pulse (!) 105   Temp (!) 97.3 F (36.3 C) (Oral)   Ht '5\' 5"'$  (1.651 m)   Wt 218 lb 2 oz (98.9 kg)   BMI 36.30 kg/m   BP Readings from Last 3 Encounters:  03/24/18 115/84  11/11/17 118/82  04/23/17 139/76    Wt Readings from Last 3 Encounters:  03/24/18 218 lb 2 oz (98.9 kg)  11/11/17 227 lb (103 kg)  12/20/16 227 lb (103 kg)     Physical Exam    Assessment & Plan:   Barbara Barber was seen today for medical management of chronic issues.  Diagnoses and all orders for this visit:  Type 2 diabetes mellitus without complication, without long-term current use of insulin (HCC) -     CBC with Differential/Platelet -     CMP14+EGFR -     Bayer DCA Hb A1c Waived  Acute midline low back pain with bilateral sciatica -     cyclobenzaprine (FLEXERIL) 10 MG tablet; Take 1 tablet (10 mg total) by mouth 3 (three) times daily as needed. for muscle spams  Other orders -     fexofenadine-pseudoephedrine (ALLEGRA-D ALLERGY & CONGESTION) 180-240 MG 24 hr tablet; Take 1 tablet by mouth at bedtime. -     fluticasone furoate-vilanterol (BREO ELLIPTA) 100-25 MCG/INH AEPB; Inhale 1 puff into the lungs daily. -     colchicine 0.6  MG tablet; Take twice daily for gout attack. (may take every two hours up to 6 doses at acute onset) -     Discontinue: gabapentin (NEURONTIN) 100 MG capsule; TAKE 1 CAPSULE BY MOUTH IN THE AFTERNOON AND 3 CAPSULES AT BEDTIME -     sitaGLIPtin-metformin (JANUMET) 50-1000 MG tablet; Take 1 tablet by mouth 2 (two) times daily with a meal. -     Dulaglutide (TRULICITY) 0.38 UE/2.8MK SOPN; INJECT 1  SYRINGE SUBCUTANEOUSLY ONCE A WEEK -     pregabalin (LYRICA) 75 MG capsule; Take 1 PO daily x 3 days, then 2 PO x 3 days, then 3PO daily. -     DULoxetine (CYMBALTA) 60 MG capsule; Take 1 capsule (60 mg total) by mouth daily.      I have discontinued Barbara Barber's gabapentin and gabapentin. I have changed her JANUMET to sitaGLIPtin-metformin and TRULICITY to Dulaglutide. I have also changed her cyclobenzaprine and DULoxetine. Additionally, I am having her start on fexofenadine-pseudoephedrine, fluticasone furoate-vilanterol, and pregabalin. Lastly, I am having her maintain her diphenhydrAMINE, PROAIR HFA, and colchicine.  Meds ordered this encounter  Medications  . fexofenadine-pseudoephedrine (ALLEGRA-D ALLERGY & CONGESTION) 180-240 MG 24 hr tablet    Sig: Take 1 tablet by mouth at bedtime.    Dispense:  90 tablet    Refill:  1  . fluticasone furoate-vilanterol (BREO ELLIPTA) 100-25 MCG/INH AEPB    Sig: Inhale 1 puff into the lungs daily.    Dispense:  28 each    Refill:  5  . cyclobenzaprine (FLEXERIL) 10 MG tablet    Sig: Take 1 tablet (10 mg total) by mouth 3 (three) times daily as needed. for muscle spams    Dispense:  60 tablet    Refill:  2    Please consider 90 day supplies to promote better adherence  . colchicine 0.6 MG tablet    Sig: Take twice daily for gout attack. (may take every two hours up to 6 doses at acute onset)    Dispense:  60 tablet    Refill:  2  . DISCONTD: gabapentin (NEURONTIN) 100 MG capsule    Sig: TAKE 1 CAPSULE BY MOUTH IN THE AFTERNOON AND 3 CAPSULES AT BEDTIME    Dispense:  120 capsule    Refill:  5    Please consider 90 day supplies to promote better adherence  . sitaGLIPtin-metformin (JANUMET) 50-1000 MG tablet    Sig: Take 1 tablet by mouth 2 (two) times daily with a meal.    Dispense:  60 tablet    Refill:  11    Please consider 90 day supplies to promote better adherence  . Dulaglutide (TRULICITY) 3.49 ZP/9.1TA SOPN    Sig: INJECT 1  SYRINGE SUBCUTANEOUSLY ONCE A WEEK    Dispense:  4 pen    Refill:  1    Please consider 90 day supplies to promote better adherence  . pregabalin (LYRICA) 75 MG capsule    Sig: Take 1 PO daily x 3 days, then 2 PO x 3 days, then 3PO daily.    Dispense:  90 capsule    Refill:  2  . DULoxetine (CYMBALTA) 60 MG capsule    Sig: Take 1 capsule (60 mg total) by mouth daily.    Dispense:  90 capsule    Refill:  1     Follow-up: Return in about 3 months (around 06/23/2018).  Claretta Fraise, M.D.

## 2018-03-25 LAB — CBC WITH DIFFERENTIAL/PLATELET
BASOS ABS: 0 10*3/uL (ref 0.0–0.2)
Basos: 0 %
EOS (ABSOLUTE): 0.4 10*3/uL (ref 0.0–0.4)
Eos: 4 %
HEMATOCRIT: 40.4 % (ref 34.0–46.6)
HEMOGLOBIN: 13.6 g/dL (ref 11.1–15.9)
Immature Grans (Abs): 0 10*3/uL (ref 0.0–0.1)
Immature Granulocytes: 0 %
LYMPHS ABS: 2.9 10*3/uL (ref 0.7–3.1)
Lymphs: 29 %
MCH: 29.8 pg (ref 26.6–33.0)
MCHC: 33.7 g/dL (ref 31.5–35.7)
MCV: 88 fL (ref 79–97)
MONOCYTES: 5 %
Monocytes Absolute: 0.5 10*3/uL (ref 0.1–0.9)
NEUTROS ABS: 6.2 10*3/uL (ref 1.4–7.0)
Neutrophils: 62 %
Platelets: 278 10*3/uL (ref 150–450)
RBC: 4.57 x10E6/uL (ref 3.77–5.28)
RDW: 14.2 % (ref 12.3–15.4)
WBC: 10 10*3/uL (ref 3.4–10.8)

## 2018-03-25 LAB — CMP14+EGFR
ALBUMIN: 4.4 g/dL (ref 3.5–5.5)
ALK PHOS: 92 IU/L (ref 39–117)
ALT: 18 IU/L (ref 0–32)
AST: 12 IU/L (ref 0–40)
Albumin/Globulin Ratio: 1.5 (ref 1.2–2.2)
BUN / CREAT RATIO: 17 (ref 9–23)
BUN: 13 mg/dL (ref 6–24)
CHLORIDE: 94 mmol/L — AB (ref 96–106)
CO2: 22 mmol/L (ref 20–29)
Calcium: 9.8 mg/dL (ref 8.7–10.2)
Creatinine, Ser: 0.78 mg/dL (ref 0.57–1.00)
GFR calc Af Amer: 105 mL/min/{1.73_m2} (ref >59)
GFR calc non Af Amer: 91 mL/min/{1.73_m2} (ref >59)
GLOBULIN, TOTAL: 3 g/dL (ref 1.5–4.5)
GLUCOSE: 277 mg/dL — AB (ref 65–99)
Potassium: 4.2 mmol/L (ref 3.5–5.2)
SODIUM: 137 mmol/L (ref 134–144)
Total Protein: 7.4 g/dL (ref 6.0–8.5)

## 2018-03-26 ENCOUNTER — Telehealth: Payer: Self-pay | Admitting: Family Medicine

## 2018-04-01 NOTE — Telephone Encounter (Signed)
Results were released to MyChart after being unable to reach patient several times

## 2018-04-02 ENCOUNTER — Ambulatory Visit: Payer: BC Managed Care – PPO | Admitting: Family Medicine

## 2018-04-02 ENCOUNTER — Encounter: Payer: Self-pay | Admitting: Family Medicine

## 2018-04-02 VITALS — BP 108/69 | HR 94 | Temp 97.8°F | Ht 65.0 in | Wt 218.5 lb

## 2018-04-02 DIAGNOSIS — J029 Acute pharyngitis, unspecified: Secondary | ICD-10-CM

## 2018-04-02 LAB — CULTURE, GROUP A STREP

## 2018-04-02 LAB — RAPID STREP SCREEN (MED CTR MEBANE ONLY): Strep Gp A Ag, IA W/Reflex: NEGATIVE

## 2018-04-02 MED ORDER — MAGIC MOUTHWASH W/LIDOCAINE: ORAL | 0 refills | Status: DC

## 2018-04-02 NOTE — Progress Notes (Signed)
Subjective: CC: Strep PCP: Barbara Gamma, MD [id number] Barbara Barber is a 47 y.o. female presenting to clinic today for:  1. Sore throat Patient reports a 2-3 day history of sore throat.  She notes that yesterday became much more severe and she felt like she was swallowing glass.  At that point she also noted white spots on her uvula and at the back of her throat.  She reports associated mild headache.  Some decreased appetite and perhaps mild nausea but no overt vomiting.  She has not measured any fevers.  She has been drinking warm fluids without difficulty.  She does note that her voice seems to be going out some and that her throat hurts more when she talks.  She is a Runner, broadcasting/film/video.  She does use Breo inhaler.  No recent antibiotic use.   ROS: Per HPI  Allergies  Allergen Reactions  . Ace Inhibitors Cough    lisinopril  . Other Hives    Talactin, (muscle relaxer), for TMJ given when pt was in [place]. High.- Hives, facial swelling CT dye, given the 90's caused throat swelling.    Past Medical History:  Diagnosis Date  . Bulging lumbar disc   . Diabetes mellitus without complication (HCC)   . Fibromyalgia   . Fibromyalgia   . Hyperlipidemia   . Lumbar herniated disc    X4  . Migraines     Current Outpatient Medications:  .  colchicine 0.6 MG tablet, Take twice daily for gout attack. (may take every two hours up to 6 doses at acute onset), Disp: 60 tablet, Rfl: 2 .  cyclobenzaprine (FLEXERIL) 10 MG tablet, Take 1 tablet (10 mg total) by mouth 3 (three) times daily as needed. for muscle spams, Disp: 60 tablet, Rfl: 2 .  Dulaglutide (TRULICITY) 0.75 MG/0.5ML SOPN, INJECT 1 SYRINGE SUBCUTANEOUSLY ONCE A WEEK, Disp: 4 pen, Rfl: 1 .  DULoxetine (CYMBALTA) 60 MG capsule, Take 1 capsule (60 mg total) by mouth daily., Disp: 90 capsule, Rfl: 1 .  fexofenadine-pseudoephedrine (ALLEGRA-D ALLERGY & CONGESTION) 180-240 MG 24 hr tablet, Take 1 tablet by mouth at bedtime., Disp: 90 tablet, Rfl:  1 .  fluticasone furoate-vilanterol (BREO ELLIPTA) 100-25 MCG/INH AEPB, Inhale 1 puff into the lungs daily., Disp: 28 each, Rfl: 5 .  pregabalin (LYRICA) 75 MG capsule, Take 1 PO daily x 3 days, then 2 PO x 3 days, then 3PO daily., Disp: 90 capsule, Rfl: 2 .  PROAIR HFA 108 (90 Base) MCG/ACT inhaler, INHALE 2 PUFFS BY MOUTH EVERY 6 HOURS AS NEEDED FOR WHEEZING FOR SHORTNESS OF BREATH, Disp: 18 g, Rfl: 3 .  sitaGLIPtin-metformin (JANUMET) 50-1000 MG tablet, Take 1 tablet by mouth 2 (two) times daily with a meal., Disp: 60 tablet, Rfl: 11 Social History   Socioeconomic History  . Marital status: Married    Spouse name: Not on file  . Number of children: Not on file  . Years of education: Not on file  . Highest education level: Not on file  Occupational History  . Not on file  Social Needs  . Financial resource strain: Not on file  . Food insecurity:    Worry: Not on file    Inability: Not on file  . Transportation needs:    Medical: Not on file    Non-medical: Not on file  Tobacco Use  . Smoking status: Current Every Day Smoker    Packs/day: 0.50    Years: 24.00    Pack years: 12.00  Types: Cigarettes  . Smokeless tobacco: Never Used  Substance and Sexual Activity  . Alcohol use: No  . Drug use: No  . Sexual activity: Yes    Birth control/protection: None  Lifestyle  . Physical activity:    Days per week: Not on file    Minutes per session: Not on file  . Stress: Not on file  Relationships  . Social connections:    Talks on phone: Not on file    Gets together: Not on file    Attends religious service: Not on file    Active member of club or organization: Not on file    Attends meetings of clubs or organizations: Not on file    Relationship status: Not on file  . Intimate partner violence:    Fear of current or ex partner: Not on file    Emotionally abused: Not on file    Physically abused: Not on file    Forced sexual activity: Not on file  Other Topics Concern   . Not on file  Social History Narrative  . Not on file   Family History  Problem Relation Age of Onset  . Pelvic inflammatory disease Mother        had to have Hysterectomy at 5423  . Ulcers Father   . Heart attack Father   . Stroke Father   . Lung disease Father   . Alcohol abuse Paternal Uncle   . Stroke Maternal Grandmother   . Heart disease Paternal Grandmother   . Diabetes Paternal Grandmother     Objective: Office vital signs reviewed. BP 108/69   Pulse 94   Temp 97.8 F (36.6 C) (Oral)   Ht 5\' 5"  (1.651 m)   Wt 218 lb 8 oz (99.1 kg)   BMI 36.36 kg/m   Physical Examination:  General: Awake, alert, well nourished, nontoxic appearing. No acute distress HEENT: Normal    Neck: No masses palpated. No lymphadenopathy    Throat: moist mucus membranes, mild oropharyngeal erythema.  She does have a couple of yellow almost vesicular appearing lesions at the tip of the uvula.  No tonsillar enlargement.  No ulcerations.  No plaques within the tongue or buccal areas.  Airway is patent  Assessment/ Plan: 47 y.o. female   1. Sore throat Likely viral.  However, will obtain strep culture for completion given severity of sore throat.  She is certainly at risk for thrush given use of inhaled corticosteroid.  I prescribed her Magic mouthwash to use over the next several days.  If symptoms worsen or do not improve, I did encourage her to return.  Will contact with results of strep culture once available. - Rapid Strep Screen ([facility] ONLY) - Culture, Group A Strep   Orders Placed This Encounter  Procedures  . Rapid Strep Screen ([facility] ONLY)  . Culture, Group A Strep    Order Specific Question:   Source    Answer:   throat   Meds ordered this encounter  Medications  . magic mouthwash w/lidocaine SOLN    Sig: Gargle and spit 10 milliliters every 6 hours as needed for sore throat.    Dispense:  240 mL    Refill:  0    60cc of lidocaine in 480cc of MMW      Ashly Hulen SkainsM Gottschalk, DO [facility] [phone]

## 2018-04-02 NOTE — Patient Instructions (Signed)
Your strep test was negative.  I am going to send this for culture just to make sure.  In the meantime, I have given you a hard copy prescription for the Magic mouthwash.   Pharyngitis Pharyngitis is a sore throat (pharynx). There is redness, pain, and swelling of your throat. Follow these instructions at home:  Drink enough fluids to keep your pee (urine) clear or pale yellow.  Only take medicine as told by your doctor. ? You may get sick again if you do not take medicine as told. Finish your medicines, even if you start to feel better. ? Do not take aspirin.  Rest.  Rinse your mouth (gargle) with salt water ( tsp of salt per 1 qt of water) every 1-2 hours. This will help the pain.  If you are not at risk for choking, you can suck on hard candy or sore throat lozenges. Contact a doctor if:  You have large, tender lumps on your neck.  You have a rash.  You cough up green, yellow-brown, or bloody spit. Get help right away if:  You have a stiff neck.  You drool or cannot swallow liquids.  You throw up (vomit) or are not able to keep medicine or liquids down.  You have very bad pain that does not go away with medicine.  You have problems breathing (not from a stuffy nose). This information is not intended to replace advice given to you by your health care provider. Make sure you discuss any questions you have with your health care provider. Document Released: 12/18/2007 Document Revised: 12/07/2015 Document Reviewed: 03/08/2013 Elsevier Interactive Patient Education  2017 [employer]

## 2018-04-05 LAB — CULTURE, GROUP A STREP

## 2018-06-23 ENCOUNTER — Ambulatory Visit: Payer: BC Managed Care – PPO | Admitting: Family Medicine

## 2018-06-26 ENCOUNTER — Other Ambulatory Visit: Payer: Self-pay | Admitting: Family Medicine

## 2018-06-26 ENCOUNTER — Other Ambulatory Visit: Payer: Self-pay | Admitting: *Deleted

## 2018-06-26 MED ORDER — PREGABALIN 225 MG PO CAPS: ORAL_CAPSULE | ORAL | 2 refills | Status: DC

## 2018-07-10 ENCOUNTER — Telehealth: Payer: Self-pay | Admitting: *Deleted

## 2018-07-10 ENCOUNTER — Other Ambulatory Visit: Payer: Self-pay | Admitting: Family Medicine

## 2018-07-10 MED ORDER — PREGABALIN 225 MG PO CAPS: 225.0000 mg | ORAL_CAPSULE | Freq: Every day | ORAL | 2 refills | Status: DC

## 2018-07-10 NOTE — Telephone Encounter (Signed)
Lyrica rx that was sent could not be accepted, quantity and instructions do not match, please resend

## 2018-07-22 ENCOUNTER — Encounter: Payer: Self-pay | Admitting: Family Medicine

## 2018-07-22 ENCOUNTER — Ambulatory Visit: Payer: BC Managed Care – PPO | Admitting: Family Medicine

## 2018-07-22 VITALS — BP 121/68 | HR 101 | Temp 98.5°F | Ht 65.0 in | Wt 222.0 lb

## 2018-07-22 DIAGNOSIS — E119 Type 2 diabetes mellitus without complications: Secondary | ICD-10-CM

## 2018-07-22 DIAGNOSIS — J4530 Mild persistent asthma, uncomplicated: Secondary | ICD-10-CM

## 2018-07-22 LAB — BAYER DCA HB A1C WAIVED: HB A1C (BAYER DCA - WAIVED): 9.7 % — ABNORMAL HIGH (ref <7.0)

## 2018-07-22 MED ORDER — FLUTICASONE FUROATE-VILANTEROL 100-25 MCG/INH IN AEPB
1.0000 | INHALATION_SPRAY | Freq: Every day | RESPIRATORY_TRACT | 2 refills | Status: DC
Start: 2018-07-22 — End: 2018-10-28

## 2018-07-22 MED ORDER — SITAGLIPTIN PHOS-METFORMIN HCL 50-1000 MG PO TABS: 1.0000 | ORAL_TABLET | Freq: Two times a day (BID) | ORAL | 2 refills | Status: DC

## 2018-07-22 MED ORDER — DULAGLUTIDE 0.75 MG/0.5ML ~~LOC~~ SOAJ: SUBCUTANEOUS | 1 refills | Status: DC

## 2018-07-22 MED ORDER — PROAIR HFA 108 (90 BASE) MCG/ACT IN AERS: INHALATION_SPRAY | RESPIRATORY_TRACT | 3 refills | Status: DC

## 2018-07-22 MED ORDER — DULOXETINE HCL 60 MG PO CPEP: 60.0000 mg | ORAL_CAPSULE | Freq: Every day | ORAL | 0 refills | Status: DC

## 2018-07-22 NOTE — Progress Notes (Signed)
Subjective:  Patient ID: Barbara Barber, female    DOB: 1970-09-23  Age: 48 y.o. MRN: [id number]  CC: Medical Management of Chronic Issues   HPI Barbara Barber presents forFollow-up of diabetes. Patient does not check blood sugar at home.   Patient denies symptoms such as polyuria, polydipsia, excessive hunger, nausea No significant hypoglycemic spells noted. Medications reviewed. Pt reports taking them regularly without complication/adverse reaction being reported today.    Depression screen [facility] 2/9 07/22/2018 03/24/2018 11/11/2017 04/23/2017 12/20/2016  Decreased Interest 1 1 0 0 0  Down, Depressed, Hopeless 0 2 0 0 0  PHQ - 2 Score 1 3 0 0 0  Altered sleeping - 2 - - -  Tired, decreased energy - 2 - - -  Change in appetite - 2 - - -  Feeling bad or failure about yourself  - 2 - - -  Trouble concentrating - 1 - - -  Moving slowly or fidgety/restless - 2 - - -  Suicidal thoughts - 0 - - -  PHQ-9 Score - 14 - - -    History Barbara Barber has a past medical history of Bulging lumbar disc, Diabetes mellitus without complication (HCC), Fibromyalgia, Fibromyalgia, Hyperlipidemia, Lumbar herniated disc, and Migraines.   Barbara has a past surgical history that includes Appendectomy.   Her family history includes Alcohol abuse in her paternal uncle; Diabetes in her paternal grandmother; Heart attack in her father; Heart disease in her paternal grandmother; Lung disease in her father; Pelvic inflammatory disease in her mother; Stroke in her father and maternal grandmother; Ulcers in her father.Barbara reports that Barbara has been smoking cigarettes. Barbara has a 12.00 pack-year smoking history. Barbara has never used smokeless tobacco. Barbara reports that Barbara does not drink alcohol or use drugs.  Current Outpatient Medications on File Prior to Visit  Medication Sig Dispense Refill  . colchicine 0.6 MG tablet Take twice daily for gout attack. (may take every two hours up to 6 doses at acute onset) 60 tablet 2  .  cyclobenzaprine (FLEXERIL) 10 MG tablet Take 1 tablet (10 mg total) by mouth 3 (three) times daily as needed. for muscle spams 60 tablet 2  . fexofenadine-pseudoephedrine (ALLEGRA-D ALLERGY & CONGESTION) 180-240 MG 24 hr tablet Take 1 tablet by mouth at bedtime. 90 tablet 1  . pregabalin (LYRICA) 225 MG capsule Take 1 capsule (225 mg total) by mouth daily. 30 capsule 2   No current facility-administered medications on file prior to visit.     ROS Review of Systems  Constitutional: Negative.   HENT: Negative for congestion.   Eyes: Negative for visual disturbance.  Respiratory: Negative for shortness of breath.   Cardiovascular: Negative for chest pain.  Gastrointestinal: Negative for abdominal pain, constipation, diarrhea, nausea and vomiting.  Genitourinary: Negative for difficulty urinating.  Musculoskeletal: Negative for arthralgias and myalgias.  Neurological: Negative for headaches.  Psychiatric/Behavioral: Negative for sleep disturbance.       Excessive work stress. Super stressed. Hashad problems with eating while sleep walking that are causing problems with diabetic diet.    Objective:  BP 121/68   Pulse (!) 101   Temp 98.5 F (36.9 C) (Oral)   Ht 5\' 5"  (1.651 m)   Wt 222 lb (100.7 kg)   BMI 36.94 kg/m   BP Readings from Last 3 Encounters:  07/22/18 121/68  04/02/18 108/69  03/24/18 115/84    Wt Readings from Last 3 Encounters:  07/22/18 222 lb (100.7 kg)  04/02/18 218 lb  8 oz (99.1 kg)  03/24/18 218 lb 2 oz (98.9 kg)     Physical Exam Constitutional:      General: Barbara is not in acute distress.    Appearance: Barbara is well-developed.  HENT:     Head: Normocephalic and atraumatic.     Right Ear: External ear normal.     Left Ear: External ear normal.     Nose: Nose normal.  Eyes:     Conjunctiva/sclera: Conjunctivae normal.     Pupils: Pupils are equal, round, and reactive to light.  Neck:     Musculoskeletal: Normal range of motion and neck supple.      Thyroid: No thyromegaly.  Cardiovascular:     Rate and Rhythm: Normal rate and regular rhythm.     Heart sounds: Normal heart sounds. No murmur.  Pulmonary:     Effort: Pulmonary effort is normal. No respiratory distress.     Breath sounds: Normal breath sounds. No wheezing or rales.  Abdominal:     General: Bowel sounds are normal. There is no distension.     Palpations: Abdomen is soft.     Tenderness: There is no abdominal tenderness.  Lymphadenopathy:     Cervical: No cervical adenopathy.  Skin:    General: Skin is warm and dry.  Neurological:     Mental Status: Barbara is alert and oriented to person, place, and time.     Deep Tendon Reflexes: Reflexes are normal and symmetric.  Psychiatric:        Behavior: Behavior normal.        Thought Content: Thought content normal.        Judgment: Judgment normal.       Assessment & Plan:   Barbara SheererWindie was seen today for medical management of chronic issues.  Diagnoses and all orders for this visit:  Type 2 diabetes mellitus without complication, without long-term current use of insulin (HCC) -     Bayer DCA Hb A1c Waived  Mild persistent asthma without complication -     PROAIR HFA 108 (90 Base) MCG/ACT inhaler; INHALE 2 PUFFS BY MOUTH EVERY 6 HOURS AS NEEDED FOR WHEEZING FOR SHORTNESS OF BREATH  Other orders -     sitaGLIPtin-metformin (JANUMET) 50-1000 MG tablet; Take 1 tablet by mouth 2 (two) times daily with a meal. -     fluticasone furoate-vilanterol (BREO ELLIPTA) 100-25 MCG/INH AEPB; Inhale 1 puff into the lungs daily. -     DULoxetine (CYMBALTA) 60 MG capsule; Take 1 capsule (60 mg total) by mouth daily. -     Dulaglutide (TRULICITY) 0.75 MG/0.5ML SOPN; INJECT 1 SYRINGE SUBCUTANEOUSLY ONCE A WEEK      I have discontinued Barbara Barber's magic mouthwash w/lidocaine. I am also having her maintain her fexofenadine-pseudoephedrine, cyclobenzaprine, colchicine, pregabalin, sitaGLIPtin-metformin, PROAIR HFA, fluticasone  furoate-vilanterol, DULoxetine, and Dulaglutide.  Meds ordered this encounter  Medications  . sitaGLIPtin-metformin (JANUMET) 50-1000 MG tablet    Sig: Take 1 tablet by mouth 2 (two) times daily with a meal.    Dispense:  60 tablet    Refill:  2    Please consider 90 day supplies to promote better adherence  . PROAIR HFA 108 (90 Base) MCG/ACT inhaler    Sig: INHALE 2 PUFFS BY MOUTH EVERY 6 HOURS AS NEEDED FOR WHEEZING FOR SHORTNESS OF BREATH    Dispense:  18 g    Refill:  3    Please consider 90 day supplies to promote better adherence  . fluticasone  furoate-vilanterol (BREO ELLIPTA) 100-25 MCG/INH AEPB    Sig: Inhale 1 puff into the lungs daily.    Dispense:  28 each    Refill:  2  . DULoxetine (CYMBALTA) 60 MG capsule    Sig: Take 1 capsule (60 mg total) by mouth daily.    Dispense:  90 capsule    Refill:  0  . Dulaglutide (TRULICITY) 0.75 MG/0.5ML SOPN    Sig: INJECT 1 SYRINGE SUBCUTANEOUSLY ONCE A WEEK    Dispense:  4 pen    Refill:  1    Please consider 90 day supplies to promote better adherence   Consider psychiatry for sleep-eating.  Follow-up: Return in about 3 months (around 10/21/2018).  [name], M.D.

## 2018-07-23 ENCOUNTER — Telehealth: Payer: Self-pay | Admitting: Family Medicine

## 2018-07-23 NOTE — Telephone Encounter (Signed)
Proair is not covered but insurance will pay for generic albuterol. Ok per [place] to approve albuterol.

## 2018-08-02 ENCOUNTER — Encounter: Payer: Self-pay | Admitting: Family Medicine

## 2018-08-13 ENCOUNTER — Ambulatory Visit: Payer: BC Managed Care – PPO | Admitting: Physician Assistant

## 2018-08-14 ENCOUNTER — Encounter: Payer: Self-pay | Admitting: Family Medicine

## 2018-08-14 ENCOUNTER — Ambulatory Visit: Payer: BC Managed Care – PPO | Admitting: Family Medicine

## 2018-08-14 VITALS — BP 114/70 | HR 96 | Temp 99.2°F | Ht 65.0 in | Wt 220.4 lb

## 2018-08-14 DIAGNOSIS — J329 Chronic sinusitis, unspecified: Secondary | ICD-10-CM | POA: Diagnosis not present

## 2018-08-14 DIAGNOSIS — R05 Cough: Secondary | ICD-10-CM | POA: Diagnosis not present

## 2018-08-14 DIAGNOSIS — R059 Cough, unspecified: Secondary | ICD-10-CM

## 2018-08-14 DIAGNOSIS — J40 Bronchitis, not specified as acute or chronic: Secondary | ICD-10-CM

## 2018-08-14 DIAGNOSIS — R52 Pain, unspecified: Secondary | ICD-10-CM

## 2018-08-14 LAB — VERITOR FLU A/B WAIVED
INFLUENZA B: NEGATIVE
Influenza A: NEGATIVE

## 2018-08-14 MED ORDER — HYDROCODONE-HOMATROPINE 5-1.5 MG/5ML PO SYRP: 5.0000 mL | ORAL_SOLUTION | Freq: Four times a day (QID) | ORAL | 0 refills | Status: AC | PRN

## 2018-08-14 MED ORDER — CLARITHROMYCIN 500 MG PO TABS: 500.0000 mg | ORAL_TABLET | Freq: Two times a day (BID) | ORAL | 0 refills | Status: AC

## 2018-08-14 MED ORDER — BETAMETHASONE SOD PHOS & ACET 6 (3-3) MG/ML IJ SUSP
6.0000 mg | Freq: Once | INTRAMUSCULAR | Status: AC
  Administered 2018-08-14: 6 mg via INTRAMUSCULAR

## 2018-08-14 NOTE — Progress Notes (Signed)
Chief Complaint  Patient presents with  . Cough    pt here today c/o cough for 3 weeks that is making her head hurt and can't sleep    HPI  Patient presents today for patient presents with profuse dry cough causing exhaustion. It is occasionally loose. Also has a stuffy nose. Diffuse headache of moderate intensity. Patient also has chills and subjective fever. Body aches all over as well. Has sapped the energy   Onset 3 weeks ago.   PMH: Smoking status noted ROS: Per HPI  Objective: BP 114/70   Pulse 96   Temp 99.2 F (37.3 C) (Oral)   Ht 5\' 5"  (1.651 m)   Wt 220 lb 6 oz (100 kg)   SpO2 98%   BMI 36.67 kg/m  Gen: NAD, alert, cooperative with exam HEENT: NCAT, EOMI, PERRL. Frontal tenderness CV: RRR, good S1/S2, no murmur Resp: CTABL, no wheezes, non-labored Abd: SNTND, BS present, no guarding or organomegaly Ext: No edema, warm Neuro: Alert and oriented, No gross deficits Flu A/B neg. Assessment and plan:  1. Body aches   2. Cough   3. Sinobronchitis     Meds ordered this encounter  Medications  . betamethasone acetate-betamethasone sodium phosphate (CELESTONE) injection 6 mg  . clarithromycin (BIAXIN) 500 MG tablet    Sig: Take 1 tablet (500 mg total) by mouth 2 (two) times daily for 10 days.    Dispense:  20 tablet    Refill:  0  . HYDROcodone-homatropine (HYCODAN) 5-1.5 MG/5ML syrup    Sig: Take 5 mLs by mouth every 6 (six) hours as needed for up to 5 days for cough.    Dispense:  100 mL    Refill:  0    Orders Placed This Encounter  Procedures  . Veritor Flu A/B Waived    Order Specific Question:   Source    Answer:   nasal    Follow up as needed.  [name], MD

## 2018-09-06 ENCOUNTER — Other Ambulatory Visit: Payer: Self-pay | Admitting: Family Medicine

## 2018-09-06 DIAGNOSIS — M5441 Lumbago with sciatica, right side: Secondary | ICD-10-CM

## 2018-09-06 DIAGNOSIS — M5442 Lumbago with sciatica, left side: Principal | ICD-10-CM

## 2018-09-07 NOTE — Telephone Encounter (Signed)
Last seen 08/14/18

## 2018-10-13 ENCOUNTER — Other Ambulatory Visit: Payer: Self-pay | Admitting: Family Medicine

## 2018-10-13 DIAGNOSIS — M5441 Lumbago with sciatica, right side: Secondary | ICD-10-CM

## 2018-10-13 DIAGNOSIS — M5442 Lumbago with sciatica, left side: Principal | ICD-10-CM

## 2018-10-19 ENCOUNTER — Other Ambulatory Visit: Payer: Self-pay | Admitting: Family Medicine

## 2018-10-19 NOTE — Telephone Encounter (Signed)
Last seen 08/14/2018  Dr Darlyn Read PCP

## 2018-10-28 ENCOUNTER — Ambulatory Visit (INDEPENDENT_AMBULATORY_CARE_PROVIDER_SITE_OTHER): Payer: BC Managed Care – PPO | Admitting: Family Medicine

## 2018-10-28 ENCOUNTER — Other Ambulatory Visit: Payer: Self-pay

## 2018-10-28 ENCOUNTER — Encounter: Payer: Self-pay | Admitting: Family Medicine

## 2018-10-28 DIAGNOSIS — M797 Fibromyalgia: Secondary | ICD-10-CM

## 2018-10-28 DIAGNOSIS — E119 Type 2 diabetes mellitus without complications: Secondary | ICD-10-CM | POA: Diagnosis not present

## 2018-10-28 DIAGNOSIS — J4530 Mild persistent asthma, uncomplicated: Secondary | ICD-10-CM

## 2018-10-28 MED ORDER — DULAGLUTIDE 1.5 MG/0.5ML ~~LOC~~ SOAJ: SUBCUTANEOUS | 12 refills | Status: DC

## 2018-10-28 MED ORDER — DULOXETINE HCL 60 MG PO CPEP: 60.0000 mg | ORAL_CAPSULE | Freq: Every day | ORAL | 3 refills | Status: DC

## 2018-10-28 MED ORDER — FLUTICASONE FUROATE-VILANTEROL 100-25 MCG/INH IN AEPB: 1.0000 | INHALATION_SPRAY | Freq: Every day | RESPIRATORY_TRACT | 11 refills | Status: DC

## 2018-10-28 MED ORDER — PREGABALIN 300 MG PO CAPS: 300.0000 mg | ORAL_CAPSULE | Freq: Every day | ORAL | 1 refills | Status: DC

## 2018-10-28 NOTE — Progress Notes (Signed)
Subjective:  Patient ID: Barbara Barber, female    DOB: 03/29/1971  Age: 48 y.o. MRN: [id number]  CC: No chief complaint on file.   HPI Barbara Barber presents forFollow-up of diabetes. Patient checks blood sugar at home.   kind of high around 170 fasting and around 165-200  postprandial Sleep eating has resolved. Patient denies symptoms such as polyuria, polydipsia, excessive hunger, nausea No significant hypoglycemic spells noted. Medications reviewed. Pt reports taking them regularly without complication/adverse reaction being reported today.  Checking feet daily.  Fibromyalgia pain still present. Ran out of lyrica last week and is more sore all over.  Has some continuing cough. NEeds breo refilled for asthma. It usually works well for her. Denies dyspnea.  History Barbara Barber has a past medical history of Bulging lumbar disc, Diabetes mellitus without complication (HCC), Fibromyalgia, Fibromyalgia, Hyperlipidemia, Lumbar herniated disc, and Migraines.   She has a past surgical history that includes Appendectomy.   Her family history includes Alcohol abuse in her paternal uncle; Diabetes in her paternal grandmother; Heart attack in her father; Heart disease in her paternal grandmother; Lung disease in her father; Pelvic inflammatory disease in her mother; Stroke in her father and maternal grandmother; Ulcers in her father.She reports that she has been smoking cigarettes. She has a 12.00 pack-year smoking history. She has never used smokeless tobacco. She reports that she does not drink alcohol or use drugs.  Current Outpatient Medications on File Prior to Visit  Medication Sig Dispense Refill  . colchicine 0.6 MG tablet Take twice daily for gout attack. (may take every two hours up to 6 doses at acute onset) 60 tablet 2  . cyclobenzaprine (FLEXERIL) 10 MG tablet Take 1 tablet by mouth three times daily as needed for muscle spasm 60 tablet 0  . fexofenadine-pseudoephedrine (ALLEGRA-D  ALLERGY & CONGESTION) 180-240 MG 24 hr tablet Take 1 tablet by mouth at bedtime. 90 tablet 1  . PROAIR HFA 108 (90 Base) MCG/ACT inhaler INHALE 2 PUFFS BY MOUTH EVERY 6 HOURS AS NEEDED FOR WHEEZING FOR SHORTNESS OF BREATH 18 g 3  . sitaGLIPtin-metformin (JANUMET) 50-1000 MG tablet Take 1 tablet by mouth 2 (two) times daily with a meal. 60 tablet 2   No current facility-administered medications on file prior to visit.     ROS Review of Systems  Constitutional: Negative.  Negative for fever.  HENT: Negative for congestion.   Eyes: Negative for visual disturbance.  Respiratory: Positive for cough. Negative for shortness of breath.   Cardiovascular: Negative for chest pain.  Gastrointestinal: Negative for abdominal pain, constipation, diarrhea, nausea and vomiting.  Genitourinary: Negative for difficulty urinating.  Musculoskeletal: Positive for back pain (sitting long stretches due to working from home). Negative for arthralgias and myalgias.  Neurological: Negative for headaches.  Psychiatric/Behavioral: Negative for sleep disturbance.    Objective:  There were no vitals taken for this visit.  BP Readings from Last 3 Encounters:  08/14/18 114/70  07/22/18 121/68  04/02/18 108/69    Wt Readings from Last 3 Encounters:  08/14/18 220 lb 6 oz (100 kg)  07/22/18 222 lb (100.7 kg)  04/02/18 218 lb 8 oz (99.1 kg)     Physical Exam  Deferred - phone call visit  Assessment & Plan:   Diagnoses and all orders for this visit:  Type 2 diabetes mellitus without complication, without long-term current use of insulin (HCC)  Fibromyalgia  Mild persistent asthma without complication  Other orders -     Dulaglutide (TRULICITY)  1.5 MG/0.5ML SOPN; Inject content of one pen under the skin weekly -     fluticasone furoate-vilanterol (BREO ELLIPTA) 100-25 MCG/INH AEPB; Inhale 1 puff into the lungs daily. -     pregabalin (LYRICA) 300 MG capsule; Take 1 capsule (300 mg total) by mouth  daily. -     DULoxetine (CYMBALTA) 60 MG capsule; Take 1 capsule (60 mg total) by mouth daily.      I have discontinued Barbara Barber Dulaglutide. I have also changed her pregabalin. Additionally, I am having her start on Dulaglutide. Lastly, I am having her maintain her fexofenadine-pseudoephedrine, colchicine, sitaGLIPtin-metformin, ProAir HFA, cyclobenzaprine, fluticasone furoate-vilanterol, and DULoxetine.  Meds ordered this encounter  Medications  . Dulaglutide (TRULICITY) 1.5 MG/0.5ML SOPN    Sig: Inject content of one pen under the skin weekly    Dispense:  4 pen    Refill:  12  . fluticasone furoate-vilanterol (BREO ELLIPTA) 100-25 MCG/INH AEPB    Sig: Inhale 1 puff into the lungs daily.    Dispense:  28 each    Refill:  11  . pregabalin (LYRICA) 300 MG capsule    Sig: Take 1 capsule (300 mg total) by mouth daily.    Dispense:  90 capsule    Refill:  1  . DULoxetine (CYMBALTA) 60 MG capsule    Sig: Take 1 capsule (60 mg total) by mouth daily.    Dispense:  90 capsule    Refill:  3   Back stretching BID  Virtual Visit via telephone Note  I discussed the limitations, risks, security and privacy concerns of performing an evaluation and management service by telephone and the availability of in person appointments. I also discussed with the patient that there may be a patient responsible charge related to this service. The patient expressed understanding and agreed to proceed. Pt. Is at home. Dr. Darlyn ReadStacks is in his office.  Follow Up Instructions:   I discussed the assessment and treatment plan with the patient. The patient was provided an opportunity to ask questions and all were answered. The patient agreed with the plan and demonstrated an understanding of the instructions.   The patient was advised to call back or seek an in-person evaluation if the symptoms worsen or if the condition fails to improve as anticipated.  Visit started: 3:46 Call ended:  3:59 Total  minutes including chart review and phone contact time: 18   Follow-up: Return in about 3 months (around 01/27/2019).  Mechele ClaudeWarren Izabelle Daus, M.D.

## 2018-11-16 ENCOUNTER — Telehealth: Payer: Self-pay | Admitting: Family Medicine

## 2018-11-16 ENCOUNTER — Other Ambulatory Visit: Payer: Self-pay | Admitting: Family Medicine

## 2018-11-16 DIAGNOSIS — M5441 Lumbago with sciatica, right side: Secondary | ICD-10-CM

## 2018-11-16 DIAGNOSIS — M5442 Lumbago with sciatica, left side: Principal | ICD-10-CM

## 2018-11-16 MED ORDER — DULAGLUTIDE 0.75 MG/0.5ML ~~LOC~~ SOAJ: 1.0000 "pen " | SUBCUTANEOUS | 2 refills | Status: DC

## 2018-11-16 MED ORDER — AMOXICILLIN 875 MG PO TABS: 875.0000 mg | ORAL_TABLET | Freq: Two times a day (BID) | ORAL | 0 refills | Status: DC

## 2018-11-16 MED ORDER — PREGABALIN 200 MG PO CAPS: 200.0000 mg | ORAL_CAPSULE | Freq: Two times a day (BID) | ORAL | 2 refills | Status: DC

## 2018-11-16 NOTE — Telephone Encounter (Signed)
Patient aware.  Highest BS 364

## 2018-11-16 NOTE — Telephone Encounter (Signed)
Pt has called stating that since Dr Darlyn Read has uped her trulicity and lyrica it has made her sugar levels go up,she has been in more pain, sleeping a lot, shaky, and real moody. Pt was crying upset on phone. Wants to know if this can be changed.

## 2018-11-16 NOTE — Telephone Encounter (Signed)
Patient would like a Rx for pcn for tooth infection.  Not able to get in with her dentist at this time

## 2018-11-16 NOTE — Telephone Encounter (Signed)
Please contact the patient I sent in scrips to reduce the dose of each. Just curious, how high is her glucose going?

## 2018-11-20 ENCOUNTER — Other Ambulatory Visit: Payer: Self-pay | Admitting: Family Medicine

## 2018-11-20 ENCOUNTER — Telehealth: Payer: Self-pay | Admitting: Family Medicine

## 2018-11-20 MED ORDER — PREGABALIN 200 MG PO CAPS: 200.0000 mg | ORAL_CAPSULE | Freq: Every day | ORAL | 2 refills | Status: DC

## 2018-11-20 NOTE — Telephone Encounter (Signed)
I sent in the requested prescription 

## 2018-12-21 ENCOUNTER — Other Ambulatory Visit: Payer: Self-pay | Admitting: Family Medicine

## 2018-12-21 DIAGNOSIS — M5441 Lumbago with sciatica, right side: Secondary | ICD-10-CM

## 2018-12-21 DIAGNOSIS — M5442 Lumbago with sciatica, left side: Secondary | ICD-10-CM

## 2019-01-28 ENCOUNTER — Other Ambulatory Visit: Payer: Self-pay | Admitting: Family Medicine

## 2019-01-28 DIAGNOSIS — M5442 Lumbago with sciatica, left side: Secondary | ICD-10-CM

## 2019-01-28 DIAGNOSIS — M5441 Lumbago with sciatica, right side: Secondary | ICD-10-CM

## 2019-01-28 NOTE — Telephone Encounter (Signed)
Please advise when back in the office  

## 2019-02-15 ENCOUNTER — Telehealth: Payer: Self-pay | Admitting: Family Medicine

## 2019-02-15 NOTE — Telephone Encounter (Signed)
Please contact the patient schedule televisit

## 2019-02-15 NOTE — Telephone Encounter (Signed)
Appt made.  Patient aware  

## 2019-02-15 NOTE — Telephone Encounter (Signed)
Patient states that she was to be seen for a 3 month follow up.  Patient states that she has not been out of her house much due to the [place] and is nervous about coming in, can patient do televisit?

## 2019-02-16 ENCOUNTER — Encounter: Payer: Self-pay | Admitting: Family Medicine

## 2019-02-16 ENCOUNTER — Ambulatory Visit (INDEPENDENT_AMBULATORY_CARE_PROVIDER_SITE_OTHER): Payer: BC Managed Care – PPO | Admitting: Family Medicine

## 2019-02-16 DIAGNOSIS — G8929 Other chronic pain: Secondary | ICD-10-CM

## 2019-02-16 DIAGNOSIS — M797 Fibromyalgia: Secondary | ICD-10-CM | POA: Diagnosis not present

## 2019-02-16 DIAGNOSIS — M546 Pain in thoracic spine: Secondary | ICD-10-CM

## 2019-02-16 DIAGNOSIS — E119 Type 2 diabetes mellitus without complications: Secondary | ICD-10-CM

## 2019-02-16 MED ORDER — TRULICITY 1.5 MG/0.5ML ~~LOC~~ SOAJ: SUBCUTANEOUS | 12 refills | Status: DC

## 2019-02-16 MED ORDER — METFORMIN HCL 1000 MG PO TABS: 1000.0000 mg | ORAL_TABLET | Freq: Two times a day (BID) | ORAL | 3 refills | Status: DC

## 2019-02-16 MED ORDER — NABUMETONE 500 MG PO TABS: 1000.0000 mg | ORAL_TABLET | Freq: Two times a day (BID) | ORAL | 1 refills | Status: DC

## 2019-02-16 NOTE — Progress Notes (Signed)
Subjective:    Patient ID: Barbara BellowsWindie M Rodden, female    DOB: 1970/08/30, 48 y.o.   MRN: [id number]   HPI: Barbara Barber is a 48 y.o. female presenting for pain in hip/back/side. Staying in to avoid covid. Has to go back to school next week.  She is very afraid of contracting the disease due to her diabetes and asthma.  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  Meanwhile she has been taking the Trulicity full dose 1.5 weekly but she forgets sometimes.  She forgets to eat at times this is usually due to a lot of pain going on.  She could not tolerate the higher dose of Lyrica so she is now taking that 200 mg a day.  She tells me that she went back up on her Trulicity to 1.5 when the Lyrica was reduced.  She tells me her blood sugar this afternoon was 218 postprandial.  That is not unusual because she is forgetful about using the Trulicity.  I explained to her that her risk factor for COVID was made even worse when her sugar went higher.  For now she should be okay to go back to work just monitor for fever shortness of breath and cough.   [facility] 07/22/2018 03/24/2018 11/11/2017 04/23/2017 12/20/2016  Decreased Interest 1 1 0 0 0  Down, Depressed, Hopeless 0 2 0 0 0  PHQ - 2 Score 1 3 0 0 0  Altered sleeping - 2 - - -  Tired, decreased energy - 2 - - -  Change in appetite - 2 - - -  Feeling bad or failure about yourself  - 2 - - -  Trouble concentrating - 1 - - -  Moving slowly or fidgety/restless - 2 - - -  Suicidal thoughts - 0 - - -  PHQ-9 Score - 14 - - -     Relevant past medical, surgical, family and social history reviewed and updated as indicated.  Interim medical history since our last visit reviewed. Allergies and medications reviewed and updated.  ROS:  Review of Systems  Constitutional: Negative.   HENT: Negative for congestion.    Eyes: Negative for visual disturbance.  Respiratory: Negative for shortness of breath.   Cardiovascular: Negative for chest pain.  Gastrointestinal: Negative for abdominal pain, constipation, diarrhea, nausea and vomiting.  Genitourinary: Negative for difficulty urinating.  Musculoskeletal: Negative for arthralgias and myalgias.  Neurological: Negative for headaches.  Psychiatric/Behavioral: Negative for sleep disturbance.     Social History   Tobacco Use  Smoking Status Current Every Day Smoker  . Packs/day: 0.50  . Years: 24.00  . Pack years: 12.00  . Types: Cigarettes  Smokeless Tobacco Never Used       Objective:     Wt Readings from Last 3 Encounters:  08/14/18 220 lb 6 oz (100 kg)  07/22/18 222 lb (100.7 kg)  04/02/18 218 lb 8 oz (99.1 kg)     Exam deferred. Pt. Harboring due to COVID 19. Phone visit performed.   Assessment & Plan:   1. Type 2 diabetes mellitus without complication, without long-term current use of insulin (HCC)   2. Fibromyalgia   3. Morbid obesity (HCC)   4. Chronic right-sided thoracic back pain     Meds ordered this encounter  Medications  .  metFORMIN (GLUCOPHAGE) 1000 MG tablet    Sig: Take 1 tablet (1,000 mg total) by mouth 2 (two) times daily with a meal.    Dispense:  180 tablet    Refill:  3  . nabumetone (RELAFEN) 500 MG tablet    Sig: Take 2 tablets (1,000 mg total) by mouth 2 (two) times daily. For muscle and joint pain    Dispense:  360 tablet    Refill:  1  . Dulaglutide (TRULICITY) 1.5 HB/7.1IR SOPN    Sig: Inject content of one pen under the skin weekly    Dispense:  4 pen    Refill:  12    No orders of the defined types were placed in this encounter.     Diagnoses and all orders for this visit:  Type 2 diabetes mellitus without complication, without long-term current use of insulin (HCC)  Fibromyalgia  Morbid obesity ([place])  Chronic right-sided thoracic back pain  Other orders -     metFORMIN (GLUCOPHAGE)  1000 MG tablet; Take 1 tablet (1,000 mg total) by mouth 2 (two) times daily with a meal. -     nabumetone (RELAFEN) 500 MG tablet; Take 2 tablets (1,000 mg total) by mouth 2 (two) times daily. For muscle and joint pain -     Dulaglutide (TRULICITY) 1.5 CV/8.9FY SOPN; Inject content of one pen under the skin weekly    Virtual Visit via telephone Note  I discussed the limitations, risks, security and privacy concerns of performing an evaluation and management service by telephone and the availability of in person appointments. The patient was identified with two identifiers. Pt.expressed understanding and agreed to proceed. Pt. Is at home. Dr. Livia Snellen is in his office.  Follow Up Instructions:   I discussed the assessment and treatment plan with the patient. The patient was provided an opportunity to ask questions and all were answered. The patient agreed with the plan and demonstrated an understanding of the instructions.   The patient was advised to call back or seek an in-person evaluation if the symptoms worsen or if the condition fails to improve as anticipated.   Total minutes including chart review and phone contact time: 25   Follow up plan: Return in about 3 months (around 05/19/2019).  Claretta Fraise, MD [place]

## 2019-03-08 ENCOUNTER — Other Ambulatory Visit: Payer: Self-pay | Admitting: Family Medicine

## 2019-03-08 DIAGNOSIS — M5441 Lumbago with sciatica, right side: Secondary | ICD-10-CM

## 2019-03-08 DIAGNOSIS — M5442 Lumbago with sciatica, left side: Secondary | ICD-10-CM

## 2019-04-11 ENCOUNTER — Other Ambulatory Visit: Payer: Self-pay | Admitting: Family Medicine

## 2019-04-19 ENCOUNTER — Other Ambulatory Visit: Payer: Self-pay | Admitting: Family Medicine

## 2019-04-19 DIAGNOSIS — M5442 Lumbago with sciatica, left side: Secondary | ICD-10-CM

## 2019-04-19 DIAGNOSIS — M5441 Lumbago with sciatica, right side: Secondary | ICD-10-CM

## 2019-05-13 ENCOUNTER — Other Ambulatory Visit: Payer: Self-pay | Admitting: Family Medicine

## 2019-05-13 DIAGNOSIS — J4530 Mild persistent asthma, uncomplicated: Secondary | ICD-10-CM

## 2019-05-14 ENCOUNTER — Telehealth: Payer: Self-pay | Admitting: Family Medicine

## 2019-05-14 ENCOUNTER — Other Ambulatory Visit: Payer: Self-pay

## 2019-05-14 ENCOUNTER — Ambulatory Visit: Payer: BC Managed Care – PPO | Admitting: Family

## 2019-05-14 ENCOUNTER — Encounter: Payer: Self-pay | Admitting: Family

## 2019-05-14 VITALS — BP 123/80 | HR 115 | Temp 96.5°F | Ht 65.0 in | Wt 211.0 lb

## 2019-05-14 DIAGNOSIS — K047 Periapical abscess without sinus: Secondary | ICD-10-CM

## 2019-05-14 MED ORDER — TRAMADOL HCL 50 MG PO TABS: 50.0000 mg | ORAL_TABLET | Freq: Three times a day (TID) | ORAL | 0 refills | Status: AC | PRN

## 2019-05-14 MED ORDER — CEFTRIAXONE SODIUM 1 G IJ SOLR
1.0000 g | Freq: Once | INTRAMUSCULAR | Status: AC
  Administered 2019-05-14: 1 g via INTRAMUSCULAR

## 2019-05-14 MED ORDER — AMOXICILLIN-POT CLAVULANATE 875-125 MG PO TABS: 1.0000 | ORAL_TABLET | Freq: Two times a day (BID) | ORAL | 0 refills | Status: AC

## 2019-05-14 NOTE — Progress Notes (Signed)
Subjective:    Patient ID: Barbara Barber, female    DOB: [birth year], 48 y.o.   MRN: [id number]  Chief Complaint  Patient presents with  . Dental Pain    right,back molar   PT presents to the office today with right lower tooth pain of a 10 out 10. She states she broke her tooth about a year ago and has had intermittent pain, but since Monday the pain has been a 10 out 10 with facial swelling and warmth. Pt has not been able to get in touch with her dentist the last 2 days.  Dental Pain  This is a new problem. The current episode started more than 1 year ago (pain started last year, but over the last few days pain has been increaed). The problem occurs constantly. The problem has been rapidly worsening. The pain is at a severity of 10/10. The pain is severe. Associated symptoms include facial pain. Pertinent negatives include no sinus pressure. She has tried NSAIDs, rest and acetaminophen for the symptoms. The treatment provided mild relief.      Review of Systems  HENT: Negative for sinus pressure.   All other systems reviewed and are negative.      Objective:   Physical Exam Vitals signs reviewed.  Constitutional:      General: She is not in acute distress.    Appearance: She is well-developed.  HENT:     Head: Normocephalic and atraumatic.     Mouth/Throat:     Dentition: Abnormal dentition.      Comments: Gum swelling, facial swelling, and warmth Eyes:     Pupils: Pupils are equal, round, and reactive to light.  Neck:     Musculoskeletal: Normal range of motion and neck supple.     Thyroid: No thyromegaly.  Cardiovascular:     Rate and Rhythm: Normal rate and regular rhythm.     Heart sounds: Normal heart sounds. No murmur.  Pulmonary:     Effort: Pulmonary effort is normal. No respiratory distress.     Breath sounds: Normal breath sounds. No wheezing.  Abdominal:     General: Bowel sounds are normal. There is no distension.     Palpations: Abdomen is soft.    Tenderness: There is no abdominal tenderness.  Musculoskeletal: Normal range of motion.        General: No tenderness.  Skin:    General: Skin is warm and dry.  Neurological:     Mental Status: She is alert and oriented to person, place, and time.     Cranial Nerves: No cranial nerve deficit.     Deep Tendon Reflexes: Reflexes are normal and symmetric.  Psychiatric:        Behavior: Behavior normal.        Thought Content: Thought content normal.        Judgment: Judgment normal.          BP 123/80   Pulse (!) 115   Temp (!) 96.5 F (35.8 C) (Temporal)   Ht 5\' 5"  (1.651 m)   Wt 211 lb (95.7 kg)   SpO2 97%   BMI 35.11 kg/m   Assessment & Plan:  AVERLEE SWARTZ comes in today with chief complaint of Dental Pain (right,back molar)   Diagnosis and orders addressed:  1. Tooth abscess Tylenol as needed Wash mouth out after meals  Follow up with dentist RTO if symptoms worsen or do not improve  - amoxicillin-clavulanate (AUGMENTIN) 875-125 MG tablet; Take 1  tablet by mouth 2 (two) times daily for 10 days.  Dispense: 20 tablet; Refill: 0 - cefTRIAXone (ROCEPHIN) injection 1 g  Evelina Dun, FNP

## 2019-05-14 NOTE — Telephone Encounter (Signed)
Patient is having dental pain, jaw is swollen, possible abcess.  Appointment scheduled this afternoon at 2:10 pm with [facility].

## 2019-05-14 NOTE — Patient Instructions (Signed)
Dental Abscess  A dental abscess is a collection of pus in or around a tooth that results from an infection. An abscess can cause pain in the affected area as well as other symptoms. Treatment is important to help with symptoms and to prevent the infection from spreading. What are the causes? This condition is caused by a bacterial infection around the root of the tooth that involves the inner part of the tooth (pulp). It may result from:  Severe tooth decay.  Trauma to the tooth, such as a broken or chipped tooth, that allows bacteria to enter into the pulp.  Severe gum disease around a tooth. What increases the risk? This condition is more likely to develop in males. It is also more likely to develop in people who:  Have dental decay (cavities).  Eat sugary snacks between meals.  Use tobacco products.  Have diabetes.  Have a weakened disease-fighting system (immune system).  Do not brush and care for their teeth regularly. What are the signs or symptoms? Symptoms of this condition include:  Severe pain in and around the infected tooth.  Swelling and redness around the infected tooth, in the mouth, or in the face.  Tenderness.  Pus drainage.  Bad breath.  Bitter taste in the mouth.  Difficulty swallowing.  Difficulty opening the mouth.  Nausea.  Vomiting.  Chills.  Swollen neck glands.  Fever. How is this diagnosed? This condition is diagnosed based on:  Your symptoms and your medical and dental history.  An examination of the infected tooth. During the exam, your dentist may tap on the infected tooth. You may also have X-rays of the affected area. How is this treated? This condition is treated by getting rid of the infection. This may be done with:  Incision and drainage. This procedure is done by making an incision in the abscess to drain out the pus. Removing pus is the first priority in treating an abscess.  Antibiotic medicines. These may be used  in certain situations.  Antibacterial mouth rinse.  A root canal. This may be performed to save the tooth. Your dentist accesses the visible part of your tooth (crown) with a drill and removes any damaged pulp. Then the space is filled and sealed off.  Tooth extraction. The tooth is pulled out if it cannot be saved by other treatment. You may also receive treatment for pain, such as:  Acetaminophen or NSAIDs.  Gels that contain a numbing medicine.  An injection to block the pain near your nerve. Follow these instructions at home: Medicines  Take over-the-counter and prescription medicines only as told by your dentist.  If you were prescribed an antibiotic, take it as told by your dentist. Do not stop taking the antibiotic even if you start to feel better.  If you were prescribed a gel that contains a numbing medicine, use it exactly as told in the directions. Do not use these gels for children who are younger than 2 years of age.  Do not drive or use heavy machinery while taking prescription pain medicine. General instructions  Rinse out your mouth often with salt water to relieve pain or swelling. To make a salt-water mixture, completely dissolve -1 tsp of salt in 1 cup of warm water.  Eat a soft diet while your abscess is healing.  Drink enough fluid to keep your urine pale yellow.  Do not apply heat to the outside of your mouth.  Do not use any products that contain nicotine or   tobacco, such as cigarettes and e-cigarettes. If you need help quitting, ask your health care provider.  Keep all follow-up visits as told by your dentist. This is important. How is this prevented?  Brush your teeth every morning and night with fluoride toothpaste. Floss one time each day.  Get regularly scheduled dental cleanings.  Consider having a dental sealant applied on teeth that have deep holes (caries).  Drink fluoridated water regularly. This includes most tap water. Check the label  on bottled water to see if it contains fluoride.  Drink water instead of sugary drinks.  Eat healthy meals and snacks.  Wear a mouth guard or face shield to protect your teeth while playing sports. Contact a health care provider if:  Your pain is worse and is not helped by medicine. Get help right away if:  You have a fever or chills.  Your symptoms suddenly get worse.  You have a very bad headache.  You have problems breathing or swallowing.  You have trouble opening your mouth.  You have swelling in your neck or around your eye. Summary  A dental abscess is a collection of pus in or around a tooth that results from an infection.  A dental abscess may result from severe tooth decay, trauma to the tooth, or severe gum disease around a tooth.  Symptoms include severe pain, swelling, redness, and drainage of pus in and around the infected tooth.  The first priority in treating a dental abscess is to drain out the pus. Treatment may also involve removing damage inside the tooth (root canal) or pulling out (extracting) the tooth. This information is not intended to replace advice given to you by your health care provider. Make sure you discuss any questions you have with your health care provider. Document Released: 07/01/2005 Document Revised: 06/13/2017 Document Reviewed: 03/03/2017 Elsevier Patient Education  2020 [employer]  

## 2019-06-09 ENCOUNTER — Other Ambulatory Visit: Payer: Self-pay | Admitting: Family Medicine

## 2019-06-09 DIAGNOSIS — M5441 Lumbago with sciatica, right side: Secondary | ICD-10-CM

## 2019-06-28 ENCOUNTER — Encounter: Payer: Self-pay | Admitting: Family Medicine

## 2019-06-28 ENCOUNTER — Ambulatory Visit (INDEPENDENT_AMBULATORY_CARE_PROVIDER_SITE_OTHER): Payer: BC Managed Care – PPO | Admitting: Family Medicine

## 2019-06-28 DIAGNOSIS — K047 Periapical abscess without sinus: Secondary | ICD-10-CM | POA: Diagnosis not present

## 2019-06-28 MED ORDER — AMOXICILLIN 875 MG PO TABS: 875.0000 mg | ORAL_TABLET | Freq: Two times a day (BID) | ORAL | 0 refills | Status: AC

## 2019-06-28 MED ORDER — ACETAMINOPHEN-CODEINE #3 300-30 MG PO TABS: 1.0000 | ORAL_TABLET | ORAL | 0 refills | Status: AC | PRN

## 2019-06-28 NOTE — Progress Notes (Signed)
Subjective:    Patient ID: Barbara Barber, female    DOB: Apr 29, 1971, 48 y.o.   MRN: [id number]   HPI: Barbara Barber is a 48 y.o. female presenting for tooth abscess. "back tooth. " Giving me fits starting 2days ago. Up today since 1 AM. 6-7/10. Worse at 10/10 during the night.  Recurring, same tooth as on 05/14/19. Broke it off last year. Dentist sent her to an oral surgeon that is fully booked. Had gotten better after the October appt. So she didn't pursue oral surgery visit. Planning to call them again today  Depression screen [facility] 2/9 05/14/2019 07/22/2018 03/24/2018 11/11/2017 04/23/2017  Decreased Interest 0 1 1 0 0  Down, Depressed, Hopeless 0 0 2 0 0  PHQ - 2 Score 0 1 3 0 0  Altered sleeping - - 2 - -  Tired, decreased energy - - 2 - -  Change in appetite - - 2 - -  Feeling bad or failure about yourself  - - 2 - -  Trouble concentrating - - 1 - -  Moving slowly or fidgety/restless - - 2 - -  Suicidal thoughts - - 0 - -  PHQ-9 Score - - 14 - -     Relevant past medical, surgical, family and social history reviewed and updated as indicated.  Interim medical history since our last visit reviewed. Allergies and medications reviewed and updated.  ROS:  Review of Systems  Constitutional: Negative for fever.  HENT: Positive for dental problem and facial swelling. Negative for congestion, sinus pressure, sinus pain and sore throat.   Respiratory: Negative.   Cardiovascular: Negative.      Social History   Tobacco Use  Smoking Status Current Every Day Smoker  . Packs/day: 0.50  . Years: 24.00  . Pack years: 12.00  . Types: Cigarettes  Smokeless Tobacco Never Used       Objective:     Wt Readings from Last 3 Encounters:  05/14/19 211 lb (95.7 kg)  08/14/18 220 lb 6 oz (100 kg)  07/22/18 222 lb (100.7 kg)     Exam deferred. Pt. Harboring due to COVID 19. Phone visit performed.   Assessment & Plan:   1. Tooth abscess     Meds ordered this encounter    Medications  . amoxicillin (AMOXIL) 875 MG tablet    Sig: Take 1 tablet (875 mg total) by mouth 2 (two) times daily for 10 days.    Dispense:  20 tablet    Refill:  0  . acetaminophen-codeine (TYLENOL #3) 300-30 MG tablet    Sig: Take 1-2 tablets by mouth every 4 (four) hours as needed for up to 5 days for moderate pain.    Dispense:  24 tablet    Refill:  0    No orders of the defined types were placed in this encounter.     Diagnoses and all orders for this visit:  Tooth abscess  Other orders -     amoxicillin (AMOXIL) 875 MG tablet; Take 1 tablet (875 mg total) by mouth 2 (two) times daily for 10 days. -     acetaminophen-codeine (TYLENOL #3) 300-30 MG tablet; Take 1-2 tablets by mouth every 4 (four) hours as needed for up to 5 days for moderate pain.    Virtual Visit via telephone Note  I discussed the limitations, risks, security and privacy concerns of performing an evaluation and management service by telephone and the availability of in person appointments. The  patient was identified with two identifiers. Pt.expressed understanding and agreed to proceed. Pt. Is at home. Dr. Darlyn Read is in his office.  Follow Up Instructions:   I discussed the assessment and treatment plan with the patient. The patient was provided an opportunity to ask questions and all were answered. The patient agreed with the plan and demonstrated an understanding of the instructions.   The patient was advised to call back or seek an in-person evaluation if the symptoms worsen or if the condition fails to improve as anticipated.   Total minutes including chart review and phone contact time: 11   Follow up plan: No follow-ups on file.  [name], MD [facility]

## 2019-07-26 ENCOUNTER — Other Ambulatory Visit: Payer: Self-pay | Admitting: Family Medicine

## 2019-07-26 DIAGNOSIS — M5441 Lumbago with sciatica, right side: Secondary | ICD-10-CM

## 2019-08-12 ENCOUNTER — Other Ambulatory Visit: Payer: Self-pay | Admitting: Family Medicine

## 2019-08-12 DIAGNOSIS — J4530 Mild persistent asthma, uncomplicated: Secondary | ICD-10-CM

## 2019-09-29 ENCOUNTER — Other Ambulatory Visit: Payer: Self-pay | Admitting: Family Medicine

## 2019-09-29 DIAGNOSIS — M5441 Lumbago with sciatica, right side: Secondary | ICD-10-CM

## 2019-10-18 ENCOUNTER — Other Ambulatory Visit: Payer: Self-pay | Admitting: Family Medicine

## 2019-10-18 DIAGNOSIS — J4530 Mild persistent asthma, uncomplicated: Secondary | ICD-10-CM

## 2019-11-05 ENCOUNTER — Encounter: Payer: Self-pay | Admitting: Family Medicine

## 2019-11-08 ENCOUNTER — Telehealth: Payer: Self-pay | Admitting: Family Medicine

## 2019-11-08 NOTE — Telephone Encounter (Signed)
Appointment scheduled for televisit with Dr. Darlyn Read on 11/10/2019.  Records requested from [facility].

## 2019-11-10 ENCOUNTER — Ambulatory Visit (INDEPENDENT_AMBULATORY_CARE_PROVIDER_SITE_OTHER): Payer: BC Managed Care – PPO | Admitting: Family Medicine

## 2019-11-10 ENCOUNTER — Encounter: Payer: Self-pay | Admitting: Family Medicine

## 2019-11-10 DIAGNOSIS — I471 Supraventricular tachycardia: Secondary | ICD-10-CM | POA: Diagnosis not present

## 2019-11-10 MED ORDER — METOPROLOL SUCCINATE ER 50 MG PO TB24: 50.0000 mg | ORAL_TABLET | Freq: Every day | ORAL | 2 refills | Status: DC

## 2019-11-10 NOTE — Progress Notes (Signed)
Subjective:    Patient ID: Barbara Barber, female    DOB: 10/13/1970, 49 y.o.   MRN: [id number]   HPI: Barbara Barber is a 49 y.o. female presenting for sudden onset of dizziness and palpitations. Occurred 6 days ago. Went to E.D. was told she was in SVT. HR was 237 bpm. Meds given at E.D. brought rate down to 72. Feels extremely tired. Decreased energy. Vision going in and out with nausea. Feels like she isi n a balloon and the room is shaking. Concern in E.D. for heart damage. "Scare tactic."  Had elevated liver and heart enzymes. Cardiologist was recommended. Taking ASA 81 mg daily.    Depression screen [facility] 2/9 05/14/2019 07/22/2018 03/24/2018 11/11/2017 04/23/2017  Decreased Interest 0 1 1 0 0  Down, Depressed, Hopeless 0 0 2 0 0  PHQ - 2 Score 0 1 3 0 0  Altered sleeping - - 2 - -  Tired, decreased energy - - 2 - -  Change in appetite - - 2 - -  Feeling bad or failure about yourself  - - 2 - -  Trouble concentrating - - 1 - -  Moving slowly or fidgety/restless - - 2 - -  Suicidal thoughts - - 0 - -  PHQ-9 Score - - 14 - -     Relevant past medical, surgical, family and social history reviewed and updated as indicated.  Interim medical history since our last visit reviewed. Allergies and medications reviewed and updated.  ROS:  Review of Systems  Constitutional: Positive for activity change and fatigue.  HENT: Negative for congestion.   Eyes: Negative for visual disturbance.  Respiratory: Positive for shortness of breath.   Cardiovascular: Positive for chest pain.  Gastrointestinal: Negative for abdominal pain, constipation, diarrhea, nausea and vomiting.  Genitourinary: Negative for difficulty urinating.  Musculoskeletal: Positive for arthralgias and myalgias.  Neurological: Positive for dizziness. Negative for headaches.  Psychiatric/Behavioral: Negative for sleep disturbance.     Social History   Tobacco Use  Smoking Status Current Every Day Smoker  .  Packs/day: 0.50  . Years: 24.00  . Pack years: 12.00  . Types: Cigarettes  Smokeless Tobacco Never Used       Objective:     Wt Readings from Last 3 Encounters:  05/14/19 211 lb (95.7 kg)  08/14/18 220 lb 6 oz (100 kg)  07/22/18 222 lb (100.7 kg)     Exam deferred. Pt. Harboring due to COVID 19. Phone visit performed.   Assessment & Plan:   1. SVT (supraventricular tachycardia) (HCC)     Meds ordered this encounter  Medications  . metoprolol succinate (TOPROL-XL) 50 MG 24 hr tablet    Sig: Take 1 tablet (50 mg total) by mouth daily. For blood pressure control    Dispense:  30 tablet    Refill:  2    Orders Placed This Encounter  Procedures  . Ambulatory referral to Cardiology    Referral Priority:   Routine    Referral Type:   Consultation    Referral Reason:   Specialty Services Required    Requested Specialty:   Cardiology    Number of Visits Requested:   1      Diagnoses and all orders for this visit:  SVT (supraventricular tachycardia) (HCC) -     Ambulatory referral to Cardiology  Other orders -     metoprolol succinate (TOPROL-XL) 50 MG 24 hr tablet; Take 1 tablet (50 mg total) by mouth  daily. For blood pressure control    Virtual Visit via telephone Note  I discussed the limitations, risks, security and privacy concerns of performing an evaluation and management service by telephone and the availability of in person appointments. The patient was identified with two identifiers. Pt.expressed understanding and agreed to proceed. Pt. Is at home. Dr. Livia Snellen is in his office.  Follow Up Instructions:   I discussed the assessment and treatment plan with the patient. The patient was provided an opportunity to ask questions and all were answered. The patient agreed with the plan and demonstrated an understanding of the instructions.   The patient was advised to call back or seek an in-person evaluation if the symptoms worsen or if the condition fails to  improve as anticipated.   Total minutes including chart review and phone contact time: 28   Follow up plan: Return in about 6 weeks (around 12/22/2019), or if symptoms worsen or fail to improve.  Claretta Fraise, MD [place]

## 2019-11-14 ENCOUNTER — Encounter: Payer: Self-pay | Admitting: Family Medicine

## 2019-11-16 ENCOUNTER — Telehealth: Payer: Self-pay | Admitting: Family Medicine

## 2019-11-16 NOTE — Telephone Encounter (Signed)
LM for Patient regarding Ref - Ref is in review at Cardiology Office per [employer].

## 2019-11-17 ENCOUNTER — Other Ambulatory Visit: Payer: Self-pay | Admitting: Family Medicine

## 2019-11-17 ENCOUNTER — Telehealth: Payer: Self-pay | Admitting: Family Medicine

## 2019-11-17 NOTE — Telephone Encounter (Signed)
Letter printed and faxed to patients work [phone] Attn Derrel Nip. Patient notified

## 2019-11-17 NOTE — Telephone Encounter (Signed)
Patient needs medical form emailed or faxed to her employer.  They are needing it asap.  Can someone please call her and let her know what she needs to do.

## 2019-11-17 NOTE — Telephone Encounter (Signed)
Pt called back stating that her work received the note we sent to them via fax which states that pt needs to be out of work until she sees cardiologist, but pt says her work is requesting that it be listed on the note the date of when pt is to see the cardiologist, which is on 11/29/19 and also state that cardiologist will determine if pt should go back to work after that.   Can fax note to [phone] Attn: Derrel Nip

## 2019-11-18 ENCOUNTER — Encounter: Payer: Self-pay | Admitting: Family Medicine

## 2019-11-18 ENCOUNTER — Other Ambulatory Visit: Payer: Self-pay | Admitting: Family Medicine

## 2019-11-18 NOTE — Telephone Encounter (Signed)
Patient aware note updated and faxed.

## 2019-11-23 ENCOUNTER — Other Ambulatory Visit: Payer: Self-pay | Admitting: Family Medicine

## 2019-11-23 DIAGNOSIS — M5442 Lumbago with sciatica, left side: Secondary | ICD-10-CM

## 2019-11-29 ENCOUNTER — Encounter: Payer: Self-pay | Admitting: *Deleted

## 2019-11-29 ENCOUNTER — Other Ambulatory Visit: Payer: Self-pay | Admitting: Family Medicine

## 2019-11-29 ENCOUNTER — Other Ambulatory Visit: Payer: Self-pay

## 2019-11-29 ENCOUNTER — Encounter: Payer: Self-pay | Admitting: Cardiovascular Disease

## 2019-11-29 ENCOUNTER — Ambulatory Visit: Payer: BC Managed Care – PPO | Admitting: Cardiovascular Disease

## 2019-11-29 VITALS — BP 116/68 | HR 75 | Ht 64.5 in | Wt 211.2 lb

## 2019-11-29 DIAGNOSIS — I471 Supraventricular tachycardia: Secondary | ICD-10-CM | POA: Diagnosis not present

## 2019-11-29 MED ORDER — DILTIAZEM HCL 30 MG PO TABS: 30.0000 mg | ORAL_TABLET | Freq: Two times a day (BID) | ORAL | 6 refills | Status: DC

## 2019-11-29 NOTE — Progress Notes (Addendum)
CARDIOLOGY CONSULT NOTE  Patient ID: Barbara Barber MRN: [id number] DOB/AGE: 49-Jun-1972 48 y.o.  Admit date: (Not on file) Primary Physician: [name], MD  Reason for Consultation: SVT  HPI: Barbara Barber is a 49 y.o. female who is being seen today for the evaluation of SVT at the request of [name], MD.   She was reportedly evaluated in ED for dizziness and tachycardia and was found to be in SVT with a heart rate of 237 bpm.  Her PCP has since started her on Toprol-XL 50 mg daily.  She is here with her husband.  They are both teachers.  She has experienced episodic palpitations over the years but has never experienced sustained palpitations like what occurred on 11/04/2019.  She was at home and she began experiencing palpitations shortly after 4 PM and they lasted for 6 hours before she went to the ED at [facility].  She tried deep breathing and relaxation techniques but nothing stopped the palpitations.  She had dizziness.  She had some upper right-sided chest pains when taking deep breaths.  She has been experiencing cold fingers and toes and has occasionally noticed them turning blue/purple.  This is also been going on for the past 2 to 3 weeks.  At present I do not have any documentation from [facility] and will have to request it.  Her husband said that potassium and magnesium levels were normal.  From their description, it appears she was given 2 doses of IV adenosine with restoration of sinus rhythm.  Her PCP has kept her out of work until now.  She takes Toprol-XL at night.  She has been experiencing significant fatigue and drowsiness from this.  She drinks 6 cups of coffee every morning and drinks Diet Coke during the day.  She drinks water in the summertime.  They are friends with Morene Rankins, another patient of mine, who referred them to me.   Family history: They have a child with autonomic dysfunction.  Father died of an MI at  age 43.  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  Her husband teaches history at [employer].    Allergies  Allergen Reactions  . Ace Inhibitors Cough    lisinopril  . Other Hives    Talactin, (muscle relaxer), for TMJ given when pt was in [place]. High.- Hives, facial swelling CT dye, given the 90's caused throat swelling.     Current Outpatient Medications  Medication Sig Dispense Refill  . albuterol (VENTOLIN HFA) 108 (90 Base) MCG/ACT inhaler INHALE 2 PUFFS BY MOUTH EVERY 6 HOURS AS NEEDED FOR WHEEZING FOR SHORTNESS OF BREATH (Needs to be seen before next refill) 6.7 g 0  . aspirin EC 81 MG tablet Take 81 mg by mouth daily.    Marland Kitchen BREO ELLIPTA 100-25 MCG/INH AEPB Inhale 1 puff by mouth once daily 60 each 0  . cyclobenzaprine (FLEXERIL) 10 MG tablet Take 1 tablet by mouth three times daily as needed for muscle spasm 60 tablet 0  . diphenhydrAMINE (BENADRYL) 25 MG tablet Take 25-75 mg by mouth at bedtime as needed for allergies or sleep.    . Dulaglutide (TRULICITY) 1.5 MG/0.5ML SOPN Inject content of one pen under the skin weekly 4 pen 12  . DULoxetine (CYMBALTA) 60 MG capsule Take 1 capsule (60 mg total) by mouth daily. 90 capsule 3  . metFORMIN (GLUCOPHAGE) 1000 MG tablet Take 1 tablet (1,000 mg total) by  mouth 2 (two) times daily with a meal. 180 tablet 3  . metoprolol succinate (TOPROL-XL) 50 MG 24 hr tablet Take 1 tablet (50 mg total) by mouth daily. For blood pressure control 30 tablet 2  . nabumetone (RELAFEN) 500 MG tablet TAKE 2 TABLETS BY MOUTH TWICE DAILY FOR MUSCLE AND JOINT PAIN 360 tablet 0   No current facility-administered medications for this visit.    Past Medical History:  Diagnosis Date  . Bulging lumbar disc   . Diabetes mellitus without complication (HCC)   . Fibromyalgia   . Fibromyalgia   . Hyperlipidemia   . Lumbar herniated disc    X4  . Migraines     Past Surgical History:  Procedure Laterality Date  .  APPENDECTOMY      Social History   Socioeconomic History  . Marital status: Married    Spouse name: Not on file  . Number of children: Not on file  . Years of education: Not on file  . Highest education level: Not on file  Occupational History  . Not on file  Tobacco Use  . Smoking status: Current Every Day Smoker    Packs/day: 0.50    Years: 24.00    Pack years: 12.00    Types: Cigarettes  . Smokeless tobacco: Never Used  . Tobacco comment: 1 pack per day   Substance and Sexual Activity  . Alcohol use: No  . Drug use: No  . Sexual activity: Yes    Birth control/protection: None  Other Topics Concern  . Not on file  Social History Narrative  . Not on file   Social Determinants of Health   Financial Resource Strain:   . Difficulty of Paying Living Expenses:   Food Insecurity:   . Worried About Programme researcher, broadcasting/film/video in the Last Year:   . Barista in the Last Year:   Transportation Needs:   . Freight forwarder (Medical):   Marland Kitchen Lack of Transportation (Non-Medical):   Physical Activity:   . Days of Exercise per Week:   . Minutes of Exercise per Session:   Stress:   . Feeling of Stress :   Social Connections:   . Frequency of Communication with Friends and Family:   . Frequency of Social Gatherings with Friends and Family:   . Attends Religious Services:   . Active Member of Clubs or Organizations:   . Attends Banker Meetings:   Marland Kitchen Marital Status:   Intimate Partner Violence:   . Fear of Current or Ex-Partner:   . Emotionally Abused:   Marland Kitchen Physically Abused:   . Sexually Abused:       Current Meds  Medication Sig  . albuterol (VENTOLIN HFA) 108 (90 Base) MCG/ACT inhaler INHALE 2 PUFFS BY MOUTH EVERY 6 HOURS AS NEEDED FOR WHEEZING FOR SHORTNESS OF BREATH (Needs to be seen before next refill)  . aspirin EC 81 MG tablet Take 81 mg by mouth daily.  Marland Kitchen BREO ELLIPTA 100-25 MCG/INH AEPB Inhale 1 puff by mouth once daily  . cyclobenzaprine  (FLEXERIL) 10 MG tablet Take 1 tablet by mouth three times daily as needed for muscle spasm  . diphenhydrAMINE (BENADRYL) 25 MG tablet Take 25-75 mg by mouth at bedtime as needed for allergies or sleep.  . Dulaglutide (TRULICITY) 1.5 MG/0.5ML SOPN Inject content of one pen under the skin weekly  . DULoxetine (CYMBALTA) 60 MG capsule Take 1 capsule (60 mg total) by mouth daily.  . metFORMIN (  GLUCOPHAGE) 1000 MG tablet Take 1 tablet (1,000 mg total) by mouth 2 (two) times daily with a meal.  . metoprolol succinate (TOPROL-XL) 50 MG 24 hr tablet Take 1 tablet (50 mg total) by mouth daily. For blood pressure control  . nabumetone (RELAFEN) 500 MG tablet TAKE 2 TABLETS BY MOUTH TWICE DAILY FOR MUSCLE AND JOINT PAIN      Review of systems complete and found to be negative unless listed above in HPI   [facility], LPN was present throughout the entirety of the encounter.  Physical exam Blood pressure 116/68, pulse 75, height 5' 4.5" (1.638 m), weight 211 lb 3.2 oz (95.8 kg), SpO2 92 %. General: NAD Neck: No JVD, no thyromegaly or thyroid nodule.  Lungs: Clear to auscultation bilaterally with normal respiratory effort. CV: Nondisplaced PMI. Regular rate and rhythm, normal S1/S2, no S3/S4, no murmur.  No peripheral edema.  No carotid bruit.    Abdomen: Soft, nontender, no distention.  Skin: Intact without lesions or rashes.  Neurologic: Alert and oriented x 3.  Psych: Normal affect. Extremities: No clubbing or cyanosis.  HEENT: Normal.   ECG: Most recent ECG reviewed.   Labs: Lab Results  Component Value Date/Time   K 4.2 03/24/2018 05:22 PM   BUN 13 03/24/2018 05:22 PM   CREATININE 0.78 03/24/2018 05:22 PM   ALT 18 03/24/2018 05:22 PM   HGB 13.6 03/24/2018 05:22 PM     Lipids: Lab Results  Component Value Date/Time   LDLCALC 116 (H) 04/29/2016 04:49 PM   CHOL 193 04/29/2016 04:49 PM   TRIG 145 04/29/2016 04:49 PM   HDL 48 04/29/2016 04:49 PM        ASSESSMENT AND PLAN:    1.  SVT: I will request records from [facility].  Currently on Toprol XL 50 mg every evening.  She has been experiencing significant fatigue and drowsiness from this.  I will switch Toprol-XL to short acting diltiazem 30 mg twice daily. I will order a 2-D echocardiogram with Doppler to evaluate cardiac structure, function, and regional wall motion.  I spoke to her about strategies for SVT including medication dosage escalation as needed and potentially curative ablation. I will also obtain a 30-day event monitor.  I will keep her out of work this week.    Disposition: Follow up in 6 to 8 weeks in office  Signed: Kate Sable, M.D., F.A.C.C.  11/29/2019, 9:17 AM

## 2019-11-29 NOTE — Patient Instructions (Addendum)
Medication Instructions:   Stop Toprol XL.  Begin Diltiazem 30mg  twice a day.  Continue all other medications.    Labwork: none  Testing/Procedures:  Your physician has requested that you have an echocardiogram. Echocardiography is a painless test that uses sound waves to create images of your heart. It provides your doctor with information about the size and shape of your heart and how well your heart's chambers and valves are working. This procedure takes approximately one hour. There are no restrictions for this procedure.  Your physician has recommended that you wear a 30 day event monitor. Event monitors are medical devices that record the heart's electrical activity. Doctors most often these monitors to diagnose arrhythmias. Arrhythmias are problems with the speed or rhythm of the heartbeat. The monitor is a small, portable device. You can wear one while you do your normal daily activities. This is usually used to diagnose what is causing palpitations/syncope (passing out).  Office will contact with results via phone or letter.     Follow-Up: 6-8 weeks   Any Other Special Instructions Will Be Listed Below (If Applicable).  If you need a refill on your cardiac medications before your next appointment, please call your pharmacy.

## 2019-12-01 ENCOUNTER — Ambulatory Visit (INDEPENDENT_AMBULATORY_CARE_PROVIDER_SITE_OTHER): Payer: BC Managed Care – PPO

## 2019-12-01 ENCOUNTER — Other Ambulatory Visit: Payer: Self-pay

## 2019-12-01 DIAGNOSIS — I471 Supraventricular tachycardia: Secondary | ICD-10-CM | POA: Diagnosis not present

## 2019-12-06 ENCOUNTER — Telehealth: Payer: Self-pay | Admitting: *Deleted

## 2019-12-06 ENCOUNTER — Encounter: Payer: Self-pay | Admitting: *Deleted

## 2019-12-06 NOTE — Telephone Encounter (Signed)
-----   Message from Laqueta Linden, MD sent at 12/01/2019 10:12 AM EDT ----- Normal echo

## 2019-12-06 NOTE — Telephone Encounter (Signed)
Barbara Barber, [place]  10/21/8117 1:47 PM EDT    Notified via my chart, copy to pcp.

## 2019-12-07 NOTE — Telephone Encounter (Signed)
Patient called asking for results. Stated she does not use my-chart and would like a call

## 2019-12-07 NOTE — Telephone Encounter (Signed)
Notified. 

## 2019-12-08 ENCOUNTER — Ambulatory Visit (INDEPENDENT_AMBULATORY_CARE_PROVIDER_SITE_OTHER): Payer: BC Managed Care – PPO

## 2019-12-08 DIAGNOSIS — I471 Supraventricular tachycardia: Secondary | ICD-10-CM

## 2019-12-30 ENCOUNTER — Other Ambulatory Visit: Payer: Self-pay

## 2019-12-30 DIAGNOSIS — J4530 Mild persistent asthma, uncomplicated: Secondary | ICD-10-CM

## 2019-12-30 MED ORDER — ALBUTEROL SULFATE HFA 108 (90 BASE) MCG/ACT IN AERS: INHALATION_SPRAY | RESPIRATORY_TRACT | 5 refills | Status: DC

## 2019-12-31 ENCOUNTER — Other Ambulatory Visit: Payer: Self-pay | Admitting: Family Medicine

## 2019-12-31 DIAGNOSIS — J4530 Mild persistent asthma, uncomplicated: Secondary | ICD-10-CM

## 2020-01-11 ENCOUNTER — Ambulatory Visit: Payer: BC Managed Care – PPO | Admitting: Family Medicine

## 2020-01-11 ENCOUNTER — Ambulatory Visit: Payer: BC Managed Care – PPO | Admitting: Cardiovascular Disease

## 2020-01-11 NOTE — Progress Notes (Deleted)
Cardiology Office Note  Date: 01/11/2020   ID: KENT BRAUNSCHWEIG, DOB 02-16-1971, MRN [id number]  PCP:  [name], MD  Cardiologist:  No primary care provider on file. Electrophysiologist:  None   Chief Complaint: F/U SVT  History of Present Illness: Barbara Barber is a 49 y.o. female with a history of SVT/palpitations.  She was referred by her PCP and seen by Dr. Purvis Sheffield on 11/29/2019.  She had been reportedly evaluated in the ED for dizziness and tachycardia and was found to be in SVT with heart rate of 237 bpm.  Her PCP P had started her on Toprol-XL 50 mg daily.  She had experienced episodic palpitations over the years in the past but had never experienced sustained palpitations like the event that occurred on 11/04/2019.  On Monday she began experiencing palpitations lasting for approximately 6 hours.  Went to the emergency department at [facility].  She received 2 doses of IV adenosine which converted her back to her normal sinus rhythm.  She was taking her Toprol-XL at night and experiencing significant fatigue and drowsiness.  She is drinking 6 cups of coffee every morning and drinking Diet Coke during the day.  Dr. Purvis Sheffield ordered a 30-day event monitor.  He also switch her Toprol-XL to short acting diltiazem 30 mg twice a day.  A 2D echocardiogram was ordered.  Past Medical History:  Diagnosis Date  . Bulging lumbar disc   . Diabetes mellitus without complication (HCC)   . Fibromyalgia   . Fibromyalgia   . Hyperlipidemia   . Lumbar herniated disc    X4  . Migraines     Past Surgical History:  Procedure Laterality Date  . APPENDECTOMY      Current Outpatient Medications  Medication Sig Dispense Refill  . albuterol (VENTOLIN HFA) 108 (90 Base) MCG/ACT inhaler INHALE 2 PUFFS BY MOUTH EVERY 6 HOURS AS NEEDED FOR WHEEZING FOR SHORTNESS OF BREATH 6.7 g 5  . aspirin EC 81 MG tablet Take 81 mg by mouth daily.    Marland Kitchen BREO ELLIPTA 100-25 MCG/INH AEPB Inhale 1  puff by mouth once daily 60 each 0  . cyclobenzaprine (FLEXERIL) 10 MG tablet Take 1 tablet by mouth three times daily as needed for muscle spasm 60 tablet 0  . diltiazem (CARDIZEM) 30 MG tablet Take 1 tablet (30 mg total) by mouth 2 (two) times daily. 60 tablet 6  . diphenhydrAMINE (BENADRYL) 25 MG tablet Take 25-75 mg by mouth at bedtime as needed for allergies or sleep.    . Dulaglutide (TRULICITY) 1.5 MG/0.5ML SOPN Inject content of one pen under the skin weekly 4 pen 12  . DULoxetine (CYMBALTA) 60 MG capsule Take 1 capsule (60 mg total) by mouth daily. (Needs to be seen before next refill) 30 capsule 0  . metFORMIN (GLUCOPHAGE) 1000 MG tablet Take 1 tablet (1,000 mg total) by mouth 2 (two) times daily with a meal. 180 tablet 3  . nabumetone (RELAFEN) 500 MG tablet TAKE 2 TABLETS BY MOUTH TWICE DAILY FOR MUSCLE AND JOINT PAIN 360 tablet 0   No current facility-administered medications for this visit.   Allergies:  Ace inhibitors and Other   Social History: The patient  reports that she has been smoking cigarettes. She has a 12.00 pack-year smoking history. She has never used smokeless tobacco. She reports that she does not drink alcohol and does not use drugs.   Family History: The patient's family history includes Alcohol abuse in her paternal uncle;  Diabetes in her paternal grandmother; Heart attack in her father; Heart disease in her paternal grandmother; Lung disease in her father; Pelvic inflammatory disease in her mother; Stroke in her father and maternal grandmother; Ulcers in her father.   ROS:  Please see the history of present illness. Otherwise, complete review of systems is positive for {NONE DEFAULTED:18576::"none"}.  All other systems are reviewed and negative.   Physical Exam: VS:  There were no vitals taken for this visit., BMI There is no height or weight on file to calculate BMI.  Wt Readings from Last 3 Encounters:  11/29/19 211 lb 3.2 oz (95.8 kg)  05/14/19 211 lb  (95.7 kg)  08/14/18 220 lb 6 oz (100 kg)    General: Patient appears comfortable at rest. HEENT: Conjunctiva and lids normal, oropharynx clear with moist mucosa. Neck: Supple, no elevated JVP or carotid bruits, no thyromegaly. Lungs: Clear to auscultation, nonlabored breathing at rest. Cardiac: Regular rate and rhythm, no S3 or significant systolic murmur, no pericardial rub. Abdomen: Soft, nontender, no hepatomegaly, bowel sounds present, no guarding or rebound. Extremities: No pitting edema, distal pulses 2+. Skin: Warm and dry. Musculoskeletal: No kyphosis. Neuropsychiatric: Alert and oriented x3, affect grossly appropriate.  ECG:  {EKG/Telemetry Strips [phone]}  Recent Labwork: No results found for requested labs within last 8760 hours.     Component Value Date/Time   CHOL 193 04/29/2016 1649   TRIG 145 04/29/2016 1649   HDL 48 04/29/2016 1649   CHOLHDL 4.0 04/29/2016 1649   LDLCALC 116 (H) 04/29/2016 1649    Other Studies Reviewed Today:   Cardiac event monitor 12/08/2019 Patient had a cardiac event monitor placed on 12/08/2019.  Baseline sample showed sinus rhythm with heart rate of 98 bpm there was 0 serious 0 critical and 1 stable events that occurred.  There was no atrial fibrillation detected.  No bradycardia detected.  There was tachycardia.  Minimum rate during tachycardic events was 101 on 5/26 at  5:30 PM.  The average tachycardic heart rate was 111 bpm.  The maximum tachycardic rate was 148 bpm on 5/27 at 6:57 AM.  The longest period of tachycardia was 3 hours and 56 minutes on 612 at 7:48 AM him.  Overall minimum heart rate was 69.  Average heart rate 91, maximum heart rate 148.   Echo 12/01/2019 1. Left ventricular ejection fraction, by estimation, is 60 to 65%. The left ventricle has normal function. The left ventricle has no regional wall motion abnormalities. There is mild concentric left ventricular hypertrophy. Left ventricular diastolic parameters  are consistent with Grade I diastolic dysfunction (impaired relaxation). 2. Right ventricular systolic function is normal. The right ventricular size is normal. 3. The mitral valve is grossly normal. No evidence of mitral valve regurgitation. 4. The aortic valve has an indeterminant number of cusps. Aortic valve regurgitation is not visualized. No aortic stenosis is present. 5. The inferior vena cava is normal in size with greater than 50% respiratory variability, suggesting right atrial pressure of 3 mmHg. Assessment and Plan:  1. SVT (supraventricular tachycardia) (HCC)    1. SVT (supraventricular tachycardia) (HCC) ***  Medication Adjustments/Labs and Tests Ordered: Current medicines are reviewed at length with the patient today.  Concerns regarding medicines are outlined above.   Disposition: Follow-up with ***  Signed, Rennis Harding, NP 01/11/2020 7:41 AM    [facility] [address] [place], [place], [place] [address] Phone: [phone]; Fax: [phone]

## 2020-01-18 ENCOUNTER — Ambulatory Visit: Payer: BC Managed Care – PPO | Admitting: Family Medicine

## 2020-01-18 ENCOUNTER — Encounter: Payer: Self-pay | Admitting: Family Medicine

## 2020-01-18 ENCOUNTER — Other Ambulatory Visit: Payer: Self-pay

## 2020-01-18 VITALS — BP 110/64 | HR 100 | Ht 64.5 in | Wt 210.2 lb

## 2020-01-18 DIAGNOSIS — I471 Supraventricular tachycardia: Secondary | ICD-10-CM

## 2020-01-18 NOTE — Patient Instructions (Addendum)

## 2020-01-18 NOTE — Progress Notes (Signed)
Cardiology Office Note  Date: 01/18/2020   ID: Barbara Barber, DOB [birth year], MRN [id number]  PCP:  [name], MD  Cardiologist:  No primary care provider on file. Electrophysiologist:  None   Chief Complaint: F/U SVT  History of Present Illness: Barbara Barber is a 49 y.o. female with a history of SVT/palpitations.  She was referred by here PCP and seen by Dr. Purvis Sheffield on 11/29/2019.  She had been reportedly evaluated in the ED for dizziness and tachycardia and was found to be in SVT with heart rate of 237 bpm.  Her PCP  had started her on Toprol-XL 50 mg daily.  She had experienced episodic palpitations over the years in the past but had never experienced sustained palpitations like the event that occurred on 11/04/2019.  On the prior Monday she began experiencing palpitations lasting for approximately 6 hours.  Went to the emergency department at [facility].  She received 2 doses of IV adenosine which converted her back to her normal sinus rhythm.  She was taking her Toprol-XL at night and experiencing significant fatigue and drowsiness.  She was drinking 6 cups of coffee every morning and drinking Diet Coke during the day.  Dr. Purvis Sheffield ordered a 30-day event monitor.  He also switch her Toprol-XL to short acting diltiazem 30 mg twice a day.  A 2D echocardiogram was ordered.  Patient states she has been doing well but still occasionally has transient short-lived palpitations which are not bothersome.  She continues to take Cardizem 30 mg p.o. twice daily.  No complaints of anginal or exertional symptoms.  No episodes of SVT at all.  States she is cut down on the amount of coffee she is drinking in addition to decreasing caffeinated soft drinks.  He states this has helped significantly.   Past Medical History:  Diagnosis Date  . Bulging lumbar disc   . Diabetes mellitus without complication (HCC)   . Fibromyalgia   . Fibromyalgia   . Hyperlipidemia   . Lumbar  herniated disc    X4  . Migraines     Past Surgical History:  Procedure Laterality Date  . APPENDECTOMY      Current Outpatient Medications  Medication Sig Dispense Refill  . albuterol (VENTOLIN HFA) 108 (90 Base) MCG/ACT inhaler INHALE 2 PUFFS BY MOUTH EVERY 6 HOURS AS NEEDED FOR WHEEZING FOR SHORTNESS OF BREATH 6.7 g 5  . BREO ELLIPTA 100-25 MCG/INH AEPB Inhale 1 puff by mouth once daily 60 each 0  . cyclobenzaprine (FLEXERIL) 10 MG tablet Take 1 tablet by mouth three times daily as needed for muscle spasm 60 tablet 0  . diltiazem (CARDIZEM) 30 MG tablet Take 1 tablet (30 mg total) by mouth 2 (two) times daily. 60 tablet 6  . diphenhydrAMINE (BENADRYL) 25 MG tablet Take 25-75 mg by mouth at bedtime as needed for allergies or sleep.    . Dulaglutide (TRULICITY) 1.5 MG/0.5ML SOPN Inject content of one pen under the skin weekly 4 pen 12  . metFORMIN (GLUCOPHAGE) 1000 MG tablet Take 1 tablet (1,000 mg total) by mouth 2 (two) times daily with a meal. 180 tablet 3  . nabumetone (RELAFEN) 500 MG tablet TAKE 2 TABLETS BY MOUTH TWICE DAILY FOR MUSCLE AND JOINT PAIN 360 tablet 0  . aspirin EC 81 MG tablet Take 81 mg by mouth daily. (Patient not taking: Reported on 01/18/2020)    . DULoxetine (CYMBALTA) 60 MG capsule Take 1 capsule (60 mg total) by mouth daily. (  Needs to be seen before next refill) (Patient not taking: Reported on 01/18/2020) 30 capsule 0   No current facility-administered medications for this visit.   Allergies:  Ace inhibitors and Other   Social History: The patient  reports that she has been smoking cigarettes. She has a 12.00 pack-year smoking history. She has never used smokeless tobacco. She reports that she does not drink alcohol and does not use drugs.   Family History: The patient's family history includes Alcohol abuse in her paternal uncle; Diabetes in her paternal grandmother; Heart attack in her father; Heart disease in her paternal grandmother; Lung disease in her  father; Pelvic inflammatory disease in her mother; Stroke in her father and maternal grandmother; Ulcers in her father.   ROS:  Please see the history of present illness. Otherwise, complete review of systems is positive for none.  All other systems are reviewed and negative.   Physical Exam: VS:  BP 110/64   Pulse 100   Ht 5' 4.5" (1.638 m)   Wt 210 lb 3.2 oz (95.3 kg)   SpO2 96%   BMI 35.52 kg/m , BMI Body mass index is 35.52 kg/m.  Wt Readings from Last 3 Encounters:  01/18/20 210 lb 3.2 oz (95.3 kg)  11/29/19 211 lb 3.2 oz (95.8 kg)  05/14/19 211 lb (95.7 kg)    General: Patient appears comfortable at rest. Neck: Supple, no elevated JVP or carotid bruits, no thyromegaly. Lungs: Clear to auscultation, nonlabored breathing at rest. Cardiac: Regular rate and rhythm, no S3 or significant systolic murmur, no pericardial rub. Extremities: No pitting edema, distal pulses 2+. Skin: Warm and dry. Musculoskeletal: No kyphosis. Neuropsychiatric: Alert and oriented x3, affect grossly appropriate.  ECG:    Recent Labwork: No results found for requested labs within last 8760 hours.     Component Value Date/Time   CHOL 193 04/29/2016 1649   TRIG 145 04/29/2016 1649   HDL 48 04/29/2016 1649   CHOLHDL 4.0 04/29/2016 1649   LDLCALC 116 (H) 04/29/2016 1649    Other Studies Reviewed Today:   Cardiac event monitor 12/08/2019 Patient had a cardiac event monitor placed on 12/08/2019.  Baseline sample showed sinus rhythm with heart rate of 98 bpm there was 0 serious 0 critical and 1 stable events that occurred.  There was no atrial fibrillation detected.  No bradycardia detected.  There was tachycardia.  Minimum rate during tachycardic events was 101 on 5/26 at  5:30 PM.  The average tachycardic heart rate was 111 bpm.  The maximum tachycardic rate was 148 bpm on 5/27 at 6:57 AM.  The longest period of tachycardia was 3 hours and 56 minutes on 612 at 7:48 AM him.  Overall minimum heart rate was  69.  Average heart rate 91, maximum heart rate 148.   Echo 12/01/2019 1. Left ventricular ejection fraction, by estimation, is 60 to 65%. The left ventricle has normal function. The left ventricle has no regional wall motion abnormalities. There is mild concentric left ventricular hypertrophy. Left ventricular diastolic parameters are consistent with Grade I diastolic dysfunction (impaired relaxation). 2. Right ventricular systolic function is normal. The right ventricular size is normal. 3. The mitral valve is grossly normal. No evidence of mitral valve regurgitation. 4. The aortic valve has an indeterminant number of cusps. Aortic valve regurgitation is not visualized. No aortic stenosis is present. 5. The inferior vena cava is normal in size with greater than 50% respiratory variability, suggesting right atrial pressure of 3 mmHg.  Assessment and Plan:   1. SVT (supraventricular tachycardia) (HCC) No recent episodes of significant SVT or racing heart/palpitations.  Continues to take Cardizem 30 mg p.o. daily.  Advised her if she has any breakthrough palpitations after taking these dosages she may take an extra Cardizem if needed for palpitations.  Medication Adjustments/Labs and Tests Ordered: Current medicines are reviewed at length with the patient today.  Concerns regarding medicines are outlined above.   Disposition: Follow-up with Dr. Purvis Sheffield APP 1 year  Signed, Rennis Harding, NP 01/18/2020 2:08 PM    [facility] at [facility] [address] [place], [place], [place] [address] Phone: [phone]; Fax: [phone]

## 2020-02-17 ENCOUNTER — Telehealth: Payer: Self-pay | Admitting: Family Medicine

## 2020-02-17 MED ORDER — BREO ELLIPTA 100-25 MCG/INH IN AEPB: INHALATION_SPRAY | RESPIRATORY_TRACT | 1 refills | Status: DC

## 2020-02-17 NOTE — Telephone Encounter (Signed)
  Prescription Request  02/17/2020  What is the name of the medication or equipment? BREO ELLIPTA 100-25 MCG/INH AEPB   Have you contacted your pharmacy to request a refill? (if applicable) YES  Which pharmacy would you like this sent to? [facility] Pt has appt with Dr. Darlyn Read on 03/07/2020  Patient notified that their request is being sent to the clinical staff for review and that they should receive a response within 2 business days.

## 2020-02-17 NOTE — Telephone Encounter (Signed)
Sent to pharm ....

## 2020-03-07 ENCOUNTER — Encounter: Payer: Self-pay | Admitting: Family Medicine

## 2020-03-07 ENCOUNTER — Ambulatory Visit: Payer: BC Managed Care – PPO | Admitting: Family Medicine

## 2020-03-07 ENCOUNTER — Other Ambulatory Visit: Payer: Self-pay

## 2020-03-07 VITALS — BP 113/76 | HR 92 | Temp 97.2°F | Ht 64.5 in | Wt 205.0 lb

## 2020-03-07 DIAGNOSIS — I471 Supraventricular tachycardia: Secondary | ICD-10-CM | POA: Diagnosis not present

## 2020-03-07 DIAGNOSIS — E119 Type 2 diabetes mellitus without complications: Secondary | ICD-10-CM | POA: Diagnosis not present

## 2020-03-07 DIAGNOSIS — G8929 Other chronic pain: Secondary | ICD-10-CM

## 2020-03-07 DIAGNOSIS — M546 Pain in thoracic spine: Secondary | ICD-10-CM

## 2020-03-07 DIAGNOSIS — M797 Fibromyalgia: Secondary | ICD-10-CM

## 2020-03-07 LAB — BAYER DCA HB A1C WAIVED: HB A1C (BAYER DCA - WAIVED): 9.1 % — ABNORMAL HIGH (ref <7.0)

## 2020-03-08 ENCOUNTER — Other Ambulatory Visit: Payer: Self-pay | Admitting: Family Medicine

## 2020-03-08 LAB — CBC WITH DIFFERENTIAL/PLATELET
Basophils Absolute: 0.1 10*3/uL (ref 0.0–0.2)
Basos: 1 %
EOS (ABSOLUTE): 0.5 10*3/uL — ABNORMAL HIGH (ref 0.0–0.4)
Eos: 4 %
Hematocrit: 39.3 % (ref 34.0–46.6)
Hemoglobin: 13.1 g/dL (ref 11.1–15.9)
Immature Grans (Abs): 0.1 10*3/uL (ref 0.0–0.1)
Immature Granulocytes: 1 %
Lymphocytes Absolute: 3 10*3/uL (ref 0.7–3.1)
Lymphs: 29 %
MCH: 30.1 pg (ref 26.6–33.0)
MCHC: 33.3 g/dL (ref 31.5–35.7)
MCV: 90 fL (ref 79–97)
Monocytes Absolute: 0.5 10*3/uL (ref 0.1–0.9)
Monocytes: 5 %
Neutrophils Absolute: 6.4 10*3/uL (ref 1.4–7.0)
Neutrophils: 60 %
Platelets: 306 10*3/uL (ref 150–450)
RBC: 4.35 x10E6/uL (ref 3.77–5.28)
RDW: 13.5 % (ref 11.7–15.4)
WBC: 10.4 10*3/uL (ref 3.4–10.8)

## 2020-03-08 LAB — CMP14+EGFR
ALT: 17 IU/L (ref 0–32)
AST: 14 IU/L (ref 0–40)
Albumin/Globulin Ratio: 2 (ref 1.2–2.2)
Albumin: 4.7 g/dL (ref 3.8–4.8)
Alkaline Phosphatase: 90 IU/L (ref 48–121)
BUN/Creatinine Ratio: 16 (ref 9–23)
BUN: 12 mg/dL (ref 6–24)
Bilirubin Total: 0.2 mg/dL (ref 0.0–1.2)
CO2: 23 mmol/L (ref 20–29)
Calcium: 9.3 mg/dL (ref 8.7–10.2)
Chloride: 100 mmol/L (ref 96–106)
Creatinine, Ser: 0.75 mg/dL (ref 0.57–1.00)
GFR calc Af Amer: 109 mL/min/{1.73_m2} (ref >59)
GFR calc non Af Amer: 95 mL/min/{1.73_m2} (ref >59)
Globulin, Total: 2.4 g/dL (ref 1.5–4.5)
Glucose: 159 mg/dL — ABNORMAL HIGH (ref 65–99)
Potassium: 4.3 mmol/L (ref 3.5–5.2)
Sodium: 138 mmol/L (ref 134–144)
Total Protein: 7.1 g/dL (ref 6.0–8.5)

## 2020-03-08 LAB — LIPID PANEL
Chol/HDL Ratio: 5.3 ratio — ABNORMAL HIGH (ref 0.0–4.4)
Cholesterol, Total: 249 mg/dL — ABNORMAL HIGH (ref 100–199)
HDL: 47 mg/dL (ref >39)
LDL Chol Calc (NIH): 172 mg/dL — ABNORMAL HIGH (ref 0–99)
Triglycerides: 161 mg/dL — ABNORMAL HIGH (ref 0–149)
VLDL Cholesterol Cal: 30 mg/dL (ref 5–40)

## 2020-03-08 MED ORDER — ROSUVASTATIN CALCIUM 10 MG PO TABS
10.0000 mg | ORAL_TABLET | Freq: Every day | ORAL | 1 refills | Status: DC
Start: 2020-03-08 — End: 2021-01-08

## 2020-03-08 NOTE — Telephone Encounter (Signed)
Lmtcb.

## 2020-03-09 NOTE — Telephone Encounter (Signed)
Pt called back regarding request for her meds to be sent in. Says Dr Darlyn Read still has not sent in the Rx's and she really needs to take her meds. Can someone else send Rx's to pharmacy asap?

## 2020-03-09 NOTE — Telephone Encounter (Signed)
Patient had appt with [facility] 8[facility]24 and he was suppose to call in Metformin Extended Release and Trulicity and pharmacy still don't have. Please call medication in and advise patient when called in

## 2020-03-10 MED ORDER — METFORMIN HCL 1000 MG PO TABS: 1000.0000 mg | ORAL_TABLET | Freq: Two times a day (BID) | ORAL | 0 refills | Status: DC

## 2020-03-10 MED ORDER — TRULICITY 1.5 MG/0.5ML ~~LOC~~ SOAJ: SUBCUTANEOUS | 2 refills | Status: DC

## 2020-03-10 NOTE — Telephone Encounter (Signed)
Patient aware that Metformin and Trulicity are being sent to [facility].

## 2020-03-13 ENCOUNTER — Encounter: Payer: Self-pay | Admitting: Family Medicine

## 2020-03-13 NOTE — Progress Notes (Signed)
Subjective:  Patient ID: Barbara Barber, female    DOB: [birth year]  Age: 49 y.o. MRN: [id number]  CC: Follow-up (diabetes)   HPI [name] presents forFollow-up of diabetes.  Not checking her blood sugar.  She is not on a good schedule with the Trulicity either.  She is missed it several times.  She does not think it is more than once a month or so. Patient denies symptoms such as polyuria, polydipsia, excessive hunger, nausea No significant hypoglycemic spells noted.   History Barbara Barber has a past medical history of Bulging lumbar disc, Diabetes mellitus without complication ([place]), Fibromyalgia, Fibromyalgia, Hyperlipidemia, Lumbar herniated disc, and Migraines.   She has a past surgical history that includes Appendectomy.   Her family history includes Alcohol abuse in her paternal uncle; Diabetes in her paternal grandmother; Heart attack in her father; Heart disease in her paternal grandmother; Lung disease in her father; Pelvic inflammatory disease in her mother; Stroke in her father and maternal grandmother; Ulcers in her father.She reports that she has been smoking cigarettes. She has a 12.00 pack-year smoking history. She has never used smokeless tobacco. She reports that she does not drink alcohol and does not use drugs.  Current Outpatient Medications on File Prior to Visit  Medication Sig Dispense Refill  . albuterol (VENTOLIN HFA) 108 (90 Base) MCG/ACT inhaler INHALE 2 PUFFS BY MOUTH EVERY 6 HOURS AS NEEDED FOR WHEEZING FOR SHORTNESS OF BREATH 6.7 g 5  . diltiazem (CARDIZEM) 30 MG tablet Take 1 tablet (30 mg total) by mouth 2 (two) times daily. 60 tablet 6  . diphenhydrAMINE (BENADRYL) 25 MG tablet Take 25-75 mg by mouth at bedtime as needed for allergies or sleep.    . fluticasone furoate-vilanterol (BREO ELLIPTA) 100-25 MCG/INH AEPB Inhale 1 puff by mouth once daily 60 each 1  . aspirin EC 81 MG tablet Take 81 mg by mouth daily. (Patient not taking: Reported on 01/18/2020)     . DULoxetine (CYMBALTA) 60 MG capsule Take 1 capsule (60 mg total) by mouth daily. (Needs to be seen before next refill) (Patient not taking: Reported on 03/07/2020) 30 capsule 0  . nabumetone (RELAFEN) 500 MG tablet TAKE 2 TABLETS BY MOUTH TWICE DAILY FOR MUSCLE AND JOINT PAIN (Patient not taking: Reported on 03/07/2020) 360 tablet 0  . [DISCONTINUED] cyclobenzaprine (FLEXERIL) 10 MG tablet Take 1 tablet by mouth three times daily as needed for muscle spasm 60 tablet 0   No current facility-administered medications on file prior to visit.    ROS Review of Systems  Constitutional: Negative.   HENT: Negative for congestion.   Eyes: Negative for visual disturbance.  Respiratory: Negative for shortness of breath.   Cardiovascular: Positive for palpitations. Negative for chest pain.  Gastrointestinal: Negative for abdominal pain, constipation, diarrhea, nausea and vomiting.  Genitourinary: Negative for difficulty urinating.  Musculoskeletal: Positive for arthralgias, back pain (Right-sided, thoracic) and myalgias.  Neurological: Negative for headaches.  Psychiatric/Behavioral: Negative for sleep disturbance.    Objective:  BP 113/76   Pulse 92   Temp (!) 97.2 F (36.2 C) (Temporal)   Ht 5' 4.5" (1.638 m)   Wt 205 lb (93 kg)   BMI 34.64 kg/m   BP Readings from Last 3 Encounters:  03/07/20 113/76  01/18/20 110/64  11/29/19 116/68    Wt Readings from Last 3 Encounters:  03/07/20 205 lb (93 kg)  01/18/20 210 lb 3.2 oz (95.3 kg)  11/29/19 211 lb 3.2 oz (95.8 kg)  Physical Exam Constitutional:      General: She is not in acute distress.    Appearance: She is well-developed.  HENT:     Head: Normocephalic and atraumatic.  Eyes:     Conjunctiva/sclera: Conjunctivae normal.     Pupils: Pupils are equal, round, and reactive to light.  Neck:     Thyroid: No thyromegaly.  Cardiovascular:     Rate and Rhythm: Normal rate and regular rhythm.     Heart sounds: Normal heart  sounds. No murmur heard.   Pulmonary:     Effort: Pulmonary effort is normal. No respiratory distress.     Breath sounds: Normal breath sounds. No wheezing or rales.  Abdominal:     General: Bowel sounds are normal. There is no distension.     Palpations: Abdomen is soft.     Tenderness: There is no abdominal tenderness.  Musculoskeletal:        General: Normal range of motion.     Cervical back: Normal range of motion and neck supple.  Lymphadenopathy:     Cervical: No cervical adenopathy.  Skin:    General: Skin is warm and dry.  Neurological:     Mental Status: She is alert and oriented to person, place, and time.  Psychiatric:        Behavior: Behavior normal.        Thought Content: Thought content normal.        Judgment: Judgment normal.       Assessment & Plan:   Michelene was seen today for follow-up.  Diagnoses and all orders for this visit:  Type 2 diabetes mellitus without complication, without long-term current use of insulin (HCC) -     CBC with Differential/Platelet -     CMP14+EGFR -     Lipid panel -     Cancel: Microalbumin / creatinine urine ratio -     Bayer DCA Hb A1c Waived  SVT (supraventricular tachycardia) (HCC) -     CBC with Differential/Platelet -     CMP14+EGFR -     Lipid panel  Fibromyalgia -     CBC with Differential/Platelet -     CMP14+EGFR -     Lipid panel  Morbid obesity (HCC) -     CBC with Differential/Platelet -     CMP14+EGFR -     Lipid panel  Chronic right-sided thoracic back pain -     CBC with Differential/Platelet -     CMP14+EGFR -     Lipid panel      I have discontinued Lalani M. Sermeno's cyclobenzaprine. I am also having her maintain her nabumetone, aspirin EC, diphenhydrAMINE, diltiazem, DULoxetine, albuterol, and Breo Ellipta.  We discussed the risks of uncontrolled diabetes with regard to heart attack stroke etc.  I advised her to double her efforts had taking the Trulicity regularly, once a week.   Check her blood sugar twice daily and Metformin twice daily as well. Check glucose before eating in the morning and again two hours after supper. Write down the results on the glucose log sheet. Bring the log with you to the next appointment  Follow-up: Return in about 6 weeks (around 04/18/2020).  Claretta Fraise, M.D.

## 2020-03-20 ENCOUNTER — Other Ambulatory Visit: Payer: Self-pay | Admitting: Family Medicine

## 2020-03-20 DIAGNOSIS — M5442 Lumbago with sciatica, left side: Secondary | ICD-10-CM

## 2020-04-05 ENCOUNTER — Encounter: Payer: Self-pay | Admitting: *Deleted

## 2020-04-18 ENCOUNTER — Other Ambulatory Visit: Payer: Self-pay | Admitting: Family Medicine

## 2020-04-18 DIAGNOSIS — M5442 Lumbago with sciatica, left side: Secondary | ICD-10-CM

## 2020-04-19 ENCOUNTER — Ambulatory Visit (INDEPENDENT_AMBULATORY_CARE_PROVIDER_SITE_OTHER): Payer: BC Managed Care – PPO | Admitting: Family Medicine

## 2020-04-19 ENCOUNTER — Other Ambulatory Visit: Payer: Self-pay

## 2020-04-19 ENCOUNTER — Encounter: Payer: Self-pay | Admitting: Family Medicine

## 2020-04-19 VITALS — BP 123/82 | HR 100 | Temp 97.7°F | Resp 20 | Ht 64.5 in | Wt 204.0 lb

## 2020-04-19 DIAGNOSIS — M5441 Lumbago with sciatica, right side: Secondary | ICD-10-CM | POA: Diagnosis not present

## 2020-04-19 DIAGNOSIS — G8929 Other chronic pain: Secondary | ICD-10-CM | POA: Diagnosis not present

## 2020-04-19 DIAGNOSIS — E119 Type 2 diabetes mellitus without complications: Secondary | ICD-10-CM

## 2020-04-19 DIAGNOSIS — M5442 Lumbago with sciatica, left side: Secondary | ICD-10-CM | POA: Diagnosis not present

## 2020-04-19 MED ORDER — TRULICITY 3 MG/0.5ML ~~LOC~~ SOAJ: 3.0000 mg | SUBCUTANEOUS | 5 refills | Status: DC

## 2020-04-19 MED ORDER — FREESTYLE LIBRE 14 DAY SENSOR MISC: 11 refills | Status: DC

## 2020-04-19 MED ORDER — PREDNISONE 10 MG PO TABS: ORAL_TABLET | ORAL | 0 refills | Status: DC

## 2020-04-19 MED ORDER — FREESTYLE LIBRE 2 SENSOR MISC: 11 refills | Status: DC

## 2020-04-19 NOTE — Progress Notes (Signed)
Subjective:  Patient ID: Barbara Barber, female    DOB: [birth year]  Age: 49 y.o. MRN: [id number]  CC: 6 week follow up   HPI Barbara Barber presents for continued low back pain.  Cyclobenzaprine usually helps but the nabumetone is not helping.  She had a cortisone injection in her back that did not help.  She would like to have refills on cyclobenzaprine.  She is using Trulicity regularly now.  Fingerstick fasting blood sugar is 235-300+ recently.  It usually a little lower though at 185-1 85-200.  Depression screen [facility] 2/9 04/19/2020 03/07/2020 05/14/2019  Decreased Interest 0 0 0  Down, Depressed, Hopeless 1 0 0  PHQ - 2 Score 1 0 0  Altered sleeping - - -  Tired, decreased energy - - -  Change in appetite - - -  Feeling bad or failure about yourself  - - -  Trouble concentrating - - -  Moving slowly or fidgety/restless - - -  Suicidal thoughts - - -  PHQ-9 Score - - -    History Barbara Barber has a past medical history of Bulging lumbar disc, Diabetes mellitus without complication (HCC), Fibromyalgia, Fibromyalgia, Hyperlipidemia, Lumbar herniated disc, and Migraines.   She has a past surgical history that includes Appendectomy.   Her family history includes Alcohol abuse in her paternal uncle; Diabetes in her paternal grandmother; Heart attack in her father; Heart disease in her paternal grandmother; Lung disease in her father; Pelvic inflammatory disease in her mother; Stroke in her father and maternal grandmother; Ulcers in her father.She reports that she has been smoking cigarettes. She has a 12.00 pack-year smoking history. She has never used smokeless tobacco. She reports that she does not drink alcohol and does not use drugs.    ROS Review of Systems  Constitutional: Negative.   HENT: Negative.   Respiratory: Negative for shortness of breath.   Cardiovascular: Negative for chest pain.  Gastrointestinal: Negative for abdominal pain.  Musculoskeletal: Positive for back  pain.    Objective:  BP 123/82    Pulse 100    Temp 97.7 F (36.5 C) (Temporal)    Resp 20    Ht 5' 4.5" (1.638 m)    Wt 204 lb (92.5 kg)    SpO2 97%    BMI 34.48 kg/m   BP Readings from Last 3 Encounters:  04/19/20 123/82  03/07/20 113/76  01/18/20 110/64    Wt Readings from Last 3 Encounters:  04/19/20 204 lb (92.5 kg)  03/07/20 205 lb (93 kg)  01/18/20 210 lb 3.2 oz (95.3 kg)     Physical Exam Constitutional:      General: She is not in acute distress.    Appearance: She is well-developed.  Cardiovascular:     Rate and Rhythm: Normal rate and regular rhythm.  Pulmonary:     Breath sounds: Normal breath sounds.  Skin:    General: Skin is warm and dry.  Neurological:     Mental Status: She is alert and oriented to person, place, and time.       Assessment & Plan:   Barbara Barber was seen today for 6 week follow up.  Diagnoses and all orders for this visit:  Chronic bilateral low back pain with bilateral sciatica  Type 2 diabetes mellitus without complication, without long-term current use of insulin (HCC)  Other orders -     Continuous Blood Gluc Sensor (FREESTYLE LIBRE 2 SENSOR) MISC; Use to check glucose fasting and before each meal. -  Continuous Blood Gluc Sensor (FREESTYLE LIBRE 14 DAY SENSOR) MISC; Use to check glucose fasting, mealtime and bedtime -     predniSONE (DELTASONE) 10 MG tablet; Take 5 daily for 2 days followed by 4,3,2 and 1 for 2 days each. -     Dulaglutide (TRULICITY) 3 MG/0.5ML SOPN; Inject 3 mg into the skin every 7 (seven) days.       I have discontinued Barbara Barber's DULoxetine and Trulicity. I am also having her start on FreeStyle Libre 2 Sensor, FreeStyle Libre 14 Day Sensor, predniSONE, and Trulicity. Additionally, I am having her maintain her nabumetone, aspirin EC, diphenhydrAMINE, diltiazem, albuterol, rosuvastatin, metFORMIN, and Breo Ellipta.  Allergies as of 04/19/2020      Reactions   Ace Inhibitors Cough    lisinopril   Other Hives   Talactin, (muscle relaxer), for TMJ given when pt was in [place].- Hives, facial swelling CT dye, given the 90's caused throat swelling.      Medication List       Accurate as of April 19, 2020 11:59 PM. If you have any questions, ask your nurse or doctor.        STOP taking these medications   DULoxetine 60 MG capsule Commonly known as: CYMBALTA Stopped by: [name], MD   Trulicity 1.5 MG/0.5ML Sopn Generic drug: Dulaglutide Replaced by: Trulicity 3 MG/0.5ML Sopn Stopped by: [name], MD     TAKE these medications   albuterol 108 (90 Base) MCG/ACT inhaler Commonly known as: VENTOLIN HFA INHALE 2 PUFFS BY MOUTH EVERY 6 HOURS AS NEEDED FOR WHEEZING FOR SHORTNESS OF BREATH   aspirin EC 81 MG tablet Take 81 mg by mouth daily.   Breo Ellipta 100-25 MCG/INH Aepb Generic drug: fluticasone furoate-vilanterol Inhale 1 puff by mouth once daily   cyclobenzaprine 10 MG tablet Commonly known as: FLEXERIL Take 1 tablet by mouth three times daily as needed for muscle spasm   diltiazem 30 MG tablet Commonly known as: Cardizem Take 1 tablet (30 mg total) by mouth 2 (two) times daily.   diphenhydrAMINE 25 MG tablet Commonly known as: BENADRYL Take 25-75 mg by mouth at bedtime as needed for allergies or sleep.   FreeStyle Libre 2 Sensor Misc Use to check glucose fasting and before each meal. Started by: [name], MD   FreeStyle Libre 14 Day Sensor Misc Use to check glucose fasting, mealtime and bedtime Started by: [name], MD   metFORMIN 1000 MG tablet Commonly known as: GLUCOPHAGE Take 1 tablet (1,000 mg total) by mouth 2 (two) times daily with a meal.   nabumetone 500 MG tablet Commonly known as: RELAFEN TAKE 2 TABLETS BY MOUTH TWICE DAILY FOR MUSCLE AND JOINT PAIN   predniSONE 10 MG tablet Commonly known as: DELTASONE Take 5 daily for 2 days followed by 4,3,2 and 1 for 2 days each. Started by: [name], MD     rosuvastatin 10 MG tablet Commonly known as: Crestor Take 1 tablet (10 mg total) by mouth daily. For cholesterol   Trulicity 3 MG/0.5ML Sopn Generic drug: Dulaglutide Inject 3 mg into the skin every 7 (seven) days. Replaces: Trulicity 1.5 MG/0.5ML Sopn Started by: [name], MD        Follow-up: Return in about 6 weeks (around 05/31/2020).  [name], M.D.

## 2020-04-28 ENCOUNTER — Encounter: Payer: Self-pay | Admitting: Family Medicine

## 2020-05-13 ENCOUNTER — Other Ambulatory Visit: Payer: Self-pay | Admitting: Family Medicine

## 2020-05-17 ENCOUNTER — Telehealth: Payer: Self-pay

## 2020-05-17 ENCOUNTER — Other Ambulatory Visit: Payer: Self-pay | Admitting: Family Medicine

## 2020-05-17 MED ORDER — METFORMIN HCL 1000 MG PO TABS: ORAL_TABLET | ORAL | 0 refills | Status: DC

## 2020-05-17 NOTE — Telephone Encounter (Signed)
  Prescription Request  05/17/2020  What is the name of the medication or equipment? metFORMIN (GLUCOPHAGE) 1000 MG tablet  BREO ELLIPTA 100-25 MCG/INH AEPB   Have you contacted your pharmacy to request a refill? (if applicable) yes  Which pharmacy would you like this sent to? [facility], pt is out of her metformin    Patient notified that their request is being sent to the clinical staff for review and that they should receive a response within 2 business days.

## 2020-05-17 NOTE — Telephone Encounter (Signed)
Refill sent patient aware  

## 2020-07-23 ENCOUNTER — Other Ambulatory Visit: Payer: Self-pay | Admitting: Family Medicine

## 2020-08-02 ENCOUNTER — Other Ambulatory Visit: Payer: Self-pay | Admitting: *Deleted

## 2020-08-02 MED ORDER — DILTIAZEM HCL 30 MG PO TABS
30.0000 mg | ORAL_TABLET | Freq: Two times a day (BID) | ORAL | 6 refills | Status: DC
Start: 2020-08-02 — End: 2021-04-09

## 2020-08-23 ENCOUNTER — Other Ambulatory Visit: Payer: Self-pay | Admitting: Family Medicine

## 2020-09-23 ENCOUNTER — Other Ambulatory Visit: Payer: Self-pay | Admitting: Family Medicine

## 2020-09-25 NOTE — Telephone Encounter (Signed)
Called patient, no answer, left message to call back.

## 2020-09-25 NOTE — Telephone Encounter (Signed)
Stacks. NTBSf 30 days give 08/24/20

## 2020-10-26 ENCOUNTER — Encounter: Payer: Self-pay | Admitting: Family Medicine

## 2020-10-26 ENCOUNTER — Ambulatory Visit (INDEPENDENT_AMBULATORY_CARE_PROVIDER_SITE_OTHER): Payer: BC Managed Care – PPO | Admitting: Family Medicine

## 2020-10-26 DIAGNOSIS — M25551 Pain in right hip: Secondary | ICD-10-CM

## 2020-10-26 DIAGNOSIS — R109 Unspecified abdominal pain: Secondary | ICD-10-CM | POA: Diagnosis not present

## 2020-10-26 DIAGNOSIS — R300 Dysuria: Secondary | ICD-10-CM

## 2020-10-26 LAB — URINALYSIS, COMPLETE
Bilirubin, UA: NEGATIVE
Glucose, UA: NEGATIVE
Ketones, UA: NEGATIVE
Leukocytes,UA: NEGATIVE
Nitrite, UA: NEGATIVE
Protein,UA: NEGATIVE
RBC, UA: NEGATIVE
Specific Gravity, UA: <1.005 — ABNORMAL LOW (ref 1.005–1.030)
Urobilinogen, Ur: 0.2 mg/dL (ref 0.2–1.0)
pH, UA: 5 (ref 5.0–7.5)

## 2020-10-26 LAB — MICROSCOPIC EXAMINATION
RBC, Urine: NONE SEEN /hpf (ref 0–2)
WBC, UA: NONE SEEN /hpf (ref 0–5)

## 2020-10-26 MED ORDER — NABUMETONE 500 MG PO TABS: 1000.0000 mg | ORAL_TABLET | Freq: Two times a day (BID) | ORAL | 1 refills | Status: DC | PRN

## 2020-10-26 NOTE — Progress Notes (Signed)
Virtual Visit via Telephone Note  I connected with [name] on 10/26/20 at 2:41 PM by telephone and verified that I am speaking with the correct person using two identifiers. Barbara Barber is currently located at home and her husband is currently with her during this visit. The provider, Gwenlyn Fudge, FNP is located in their office at time of visit.  I discussed the limitations, risks, security and privacy concerns of performing an evaluation and management service by telephone and the availability of in person appointments. I also discussed with the patient that there may be a patient responsible charge related to this service. The patient expressed understanding and agreed to proceed.  Subjective: PCP: [name], MD  Chief Complaint  Patient presents with  . Urinary Tract Infection   Patient reports pain on her right side from the top of her abdomen under her ribs to her lower abdomen where she feels like her ovaries would be.  She also has right-sided hip pain.  She feels like her symptoms have been worse over the past few weeks.  She has been drinking lots of water.  The only way to relieve her hip pain is to lie completely still.  The pain sometimes makes her nauseated.  She has also been applying a heating pad.   ROS: Per HPI  Current Outpatient Medications:  .  albuterol (VENTOLIN HFA) 108 (90 Base) MCG/ACT inhaler, INHALE 2 PUFFS BY MOUTH EVERY 6 HOURS AS NEEDED FOR WHEEZING FOR SHORTNESS OF BREATH, Disp: 6.7 g, Rfl: 5 .  aspirin EC 81 MG tablet, Take 81 mg by mouth daily. (Patient not taking: Reported on 01/18/2020), Disp: , Rfl:  .  Continuous Blood Gluc Sensor (FREESTYLE LIBRE 14 DAY SENSOR) MISC, Use to check glucose fasting, mealtime and bedtime, Disp: 2 each, Rfl: 11 .  Continuous Blood Gluc Sensor (FREESTYLE LIBRE 2 SENSOR) MISC, Use to check glucose fasting and before each meal., Disp: 1 each, Rfl: 11 .  cyclobenzaprine (FLEXERIL) 10 MG tablet, Take 1  tablet by mouth three times daily as needed for muscle spasm, Disp: 60 tablet, Rfl: 5 .  diltiazem (CARDIZEM) 30 MG tablet, Take 1 tablet (30 mg total) by mouth 2 (two) times daily., Disp: 60 tablet, Rfl: 6 .  diphenhydrAMINE (BENADRYL) 25 MG tablet, Take 25-75 mg by mouth at bedtime as needed for allergies or sleep. (Patient not taking: Reported on 04/19/2020), Disp: , Rfl:  .  Dulaglutide (TRULICITY) 3 MG/0.5ML SOPN, Inject 3 mg into the skin every 7 (seven) days., Disp: 2 mL, Rfl: 5 .  fluticasone furoate-vilanterol (BREO ELLIPTA) 100-25 MCG/INH AEPB, Inhale 1 puff by mouth once daily (Needs to be seen before next refill), Disp: 60 each, Rfl: 0 .  metFORMIN (GLUCOPHAGE) 1000 MG tablet, TAKE 1 TABLET BY MOUTH TWICE DAILY WITH  A  MEAL, Disp: 180 tablet, Rfl: 0 .  nabumetone (RELAFEN) 500 MG tablet, TAKE 2 TABLETS BY MOUTH TWICE DAILY FOR  MUSCLE  AND  JOINT  PAIN, Disp: 360 tablet, Rfl: 0 .  predniSONE (DELTASONE) 10 MG tablet, Take 5 daily for 2 days followed by 4,3,2 and 1 for 2 days each., Disp: 30 tablet, Rfl: 0 .  rosuvastatin (CRESTOR) 10 MG tablet, Take 1 tablet (10 mg total) by mouth daily. For cholesterol, Disp: 90 tablet, Rfl: 1  Allergies  Allergen Reactions  . Ace Inhibitors Cough    lisinopril  . Other Hives    Talactin, (muscle relaxer), for TMJ given when pt  was in [place]. High.- Hives, facial swelling CT dye, given the 90's caused throat swelling.    Past Medical History:  Diagnosis Date  . Bulging lumbar disc   . Diabetes mellitus without complication (HCC)   . Fibromyalgia   . Fibromyalgia   . Hyperlipidemia   . Lumbar herniated disc    X4  . Migraines     Observations/Objective: A&O  No respiratory distress or wheezing audible over the phone Mood, judgement, and thought processes all WNL   Assessment and Plan: 1. Right sided abdominal pain Urine negative.  Abdominal ultrasound ordered. - Urinalysis, Complete - Microscopic Examination - US Abdomen Limited;  Future  2. Right hip pain Relafen refilled for pain.  X-ray ordered for her hip as she has had no previous imaging. - nabumetone (RELAFEN) 500 MG tablet; Take 2 tablets (1,000 mg total) by mouth 2 (two) times daily as needed.  Dispense: 120 tablet; Refill: 1 - DG HIP UNILAT W OR W/O PELVIS 2-3 VIEWS RIGHT; Future   Follow Up Instructions:  I discussed the assessment and treatment plan with the patient. The patient was provided an opportunity to ask questions and all were answered. The patient agreed with the plan and demonstrated an understanding of the instructions.   The patient was advised to call back or seek an in-person evaluation if the symptoms worsen or if the condition fails to improve as anticipated.  The above assessment and management plan was discussed with the patient. The patient verbalized understanding of and has agreed to the management plan. Patient is aware to call the clinic if symptoms persist or worsen. Patient is aware when to return to the clinic for a follow-up visit. Patient educated on when it is appropriate to go to the emergency department.   Time call ended: 5:02 PM  I provided 21 minutes of non-face-to-face time during this encounter.  [name], MSN, APRN, FNP-C [facility] 10/26/20

## 2020-10-30 ENCOUNTER — Telehealth: Payer: Self-pay | Admitting: Family Medicine

## 2020-10-31 NOTE — Telephone Encounter (Signed)
Pt returned missed call regarding [place]. Gave pt [place] appt info. 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 Says she was told by Brayton El that someone would call her about the [place] before making the appt.  Please call patient. If pt does not answer, please LM.

## 2020-11-01 ENCOUNTER — Telehealth: Payer: Self-pay | Admitting: Family Medicine

## 2020-11-01 DIAGNOSIS — R109 Unspecified abdominal pain: Secondary | ICD-10-CM

## 2020-11-02 ENCOUNTER — Other Ambulatory Visit: Payer: Self-pay

## 2020-11-02 ENCOUNTER — Ambulatory Visit (INDEPENDENT_AMBULATORY_CARE_PROVIDER_SITE_OTHER): Payer: BC Managed Care – PPO

## 2020-11-02 DIAGNOSIS — M25551 Pain in right hip: Secondary | ICD-10-CM | POA: Diagnosis not present

## 2020-11-02 NOTE — Telephone Encounter (Signed)
I extended that order yesterday to the 25th because it looks like it is scheduled for the 22nd.

## 2020-11-03 ENCOUNTER — Ambulatory Visit (HOSPITAL_COMMUNITY): Payer: BC Managed Care – PPO

## 2020-11-03 ENCOUNTER — Telehealth: Payer: Self-pay | Admitting: Family Medicine

## 2020-11-03 ENCOUNTER — Telehealth: Payer: Self-pay

## 2020-11-03 NOTE — Telephone Encounter (Signed)
Will address when I receive the results.

## 2020-11-03 NOTE — Telephone Encounter (Signed)
Patient states that she came and got an xray done yesterday and wanting results.  Looks like results not back yet - patient aware

## 2020-11-06 ENCOUNTER — Encounter: Payer: Self-pay | Admitting: *Deleted

## 2020-11-06 NOTE — Telephone Encounter (Signed)
Response sent in My Chart message

## 2020-11-06 NOTE — Telephone Encounter (Signed)
Pt called back to get xray results. Please review and call patient ASAP. If pt is unable to answer the phone due to being at work then pt wants nurse to [facility] hopefully with xray results. Pt says she doesn't feel any better and needs to know whats going on with her because she cant keep staying out of work.

## 2020-11-06 NOTE — Telephone Encounter (Signed)
We do not have results from the hip x-ray. The Carman Ching is scheduled for Thursday is our best way to figure out what is going on.

## 2020-11-06 NOTE — Telephone Encounter (Signed)
Pt called back to get xray results. Please review and call patient ASAP.

## 2020-11-09 ENCOUNTER — Ambulatory Visit (HOSPITAL_COMMUNITY)
Admission: RE | Admit: 2020-11-09 | Discharge: 2020-11-09 | Disposition: A | Discharge status: Home / Self Care | Payer: BC Managed Care – PPO | Source: Ambulatory Visit | Attending: Family Medicine | Admitting: Family Medicine

## 2020-11-09 DIAGNOSIS — R109 Unspecified abdominal pain: Secondary | ICD-10-CM | POA: Diagnosis present

## 2020-12-04 ENCOUNTER — Other Ambulatory Visit: Payer: Self-pay | Admitting: *Deleted

## 2020-12-04 ENCOUNTER — Encounter: Payer: Self-pay | Admitting: Family Medicine

## 2020-12-04 MED ORDER — METFORMIN HCL 1000 MG PO TABS: ORAL_TABLET | ORAL | 0 refills | Status: DC

## 2020-12-04 MED ORDER — TRULICITY 3 MG/0.5ML ~~LOC~~ SOAJ: 3.0000 mg | SUBCUTANEOUS | 0 refills | Status: DC

## 2020-12-04 MED ORDER — BREO ELLIPTA 100-25 MCG/INH IN AEPB: INHALATION_SPRAY | RESPIRATORY_TRACT | 0 refills | Status: DC

## 2021-01-08 ENCOUNTER — Other Ambulatory Visit: Payer: Self-pay | Admitting: Family Medicine

## 2021-01-08 ENCOUNTER — Other Ambulatory Visit: Payer: Self-pay

## 2021-01-08 ENCOUNTER — Ambulatory Visit: Payer: BC Managed Care – PPO | Admitting: Family Medicine

## 2021-01-08 ENCOUNTER — Encounter: Payer: Self-pay | Admitting: Family Medicine

## 2021-01-08 VITALS — BP 108/65 | HR 98 | Temp 97.8°F | Ht 64.5 in | Wt 186.2 lb

## 2021-01-08 DIAGNOSIS — G959 Disease of spinal cord, unspecified: Secondary | ICD-10-CM | POA: Diagnosis not present

## 2021-01-08 DIAGNOSIS — E119 Type 2 diabetes mellitus without complications: Secondary | ICD-10-CM | POA: Diagnosis not present

## 2021-01-08 DIAGNOSIS — M25551 Pain in right hip: Secondary | ICD-10-CM

## 2021-01-08 DIAGNOSIS — J4530 Mild persistent asthma, uncomplicated: Secondary | ICD-10-CM

## 2021-01-08 DIAGNOSIS — E785 Hyperlipidemia, unspecified: Secondary | ICD-10-CM | POA: Diagnosis not present

## 2021-01-08 MED ORDER — PREDNISONE 10 MG PO TABS: ORAL_TABLET | ORAL | 0 refills | Status: DC

## 2021-01-08 MED ORDER — NABUMETONE 500 MG PO TABS: 1000.0000 mg | ORAL_TABLET | Freq: Two times a day (BID) | ORAL | 3 refills | Status: DC | PRN

## 2021-01-08 MED ORDER — METFORMIN HCL 1000 MG PO TABS: ORAL_TABLET | ORAL | 3 refills | Status: DC

## 2021-01-08 MED ORDER — ROSUVASTATIN CALCIUM 10 MG PO TABS: 10.0000 mg | ORAL_TABLET | Freq: Every day | ORAL | 3 refills | Status: DC

## 2021-01-08 MED ORDER — FLUTICASONE FUROATE-VILANTEROL 100-25 MCG/INH IN AEPB: INHALATION_SPRAY | RESPIRATORY_TRACT | 11 refills | Status: DC

## 2021-01-08 MED ORDER — TRULICITY 3 MG/0.5ML ~~LOC~~ SOAJ: 3.0000 mg | SUBCUTANEOUS | 11 refills | Status: DC

## 2021-01-08 NOTE — Progress Notes (Signed)
Subjective:  Patient ID: Barbara Barber, female    DOB: 21-Jun-1971  Age: 50 y.o. MRN: [id number]  CC: Medical Management of Chronic Issues   HPI [name] presents forFollow-up of diabetes. Patient checks blood sugar at home.  Working hard on weight loss. Using Trulicity and diet.  Patient denies symptoms such as polyuria, polydipsia, excessive hunger, nausea No significant hypoglycemic spells noted. Medications reviewed. Pt reports taking them regularly without complication/adverse reaction being reported today.   Right hip pain radiated to the lateral hip and to the anterior thigh and right groin.  4/10 baseline. With activityup to 9/10. Onset in January 2022.   History Zya has a past medical history of Bulging lumbar disc, Diabetes mellitus without complication ([place]), Fibromyalgia, Fibromyalgia, Hyperlipidemia, Lumbar herniated disc, and Migraines.   She has a past surgical history that includes Appendectomy.   Her family history includes Alcohol abuse in her paternal uncle; Diabetes in her paternal grandmother; Heart attack in her father; Heart disease in her paternal grandmother; Lung disease in her father; Pelvic inflammatory disease in her mother; Stroke in her father and maternal grandmother; Ulcers in her father.She reports that she has been smoking cigarettes. She has a 12.00 pack-year smoking history. She has never used smokeless tobacco. She reports that she does not drink alcohol and does not use drugs.  Current Outpatient Medications on File Prior to Visit  Medication Sig Dispense Refill   albuterol (VENTOLIN HFA) 108 (90 Base) MCG/ACT inhaler INHALE 2 PUFFS BY MOUTH EVERY 6 HOURS AS NEEDED FOR WHEEZING FOR SHORTNESS OF BREATH 6.7 g 5   cyclobenzaprine (FLEXERIL) 10 MG tablet Take 1 tablet by mouth three times daily as needed for muscle spasm 60 tablet 5   diltiazem (CARDIZEM) 30 MG tablet Take 1 tablet (30 mg total) by mouth 2 (two) times daily. 60 tablet 6    Continuous Blood Gluc Sensor (FREESTYLE LIBRE 14 DAY SENSOR) MISC Use to check glucose fasting, mealtime and bedtime (Patient not taking: Reported on 01/08/2021) 2 each 11   Continuous Blood Gluc Sensor (FREESTYLE LIBRE 2 SENSOR) MISC Use to check glucose fasting and before each meal. (Patient not taking: Reported on 01/08/2021) 1 each 11   No current facility-administered medications on file prior to visit.    ROS Review of Systems  Constitutional: Negative.   HENT: Negative.    Eyes:  Negative for visual disturbance.  Respiratory:  Negative for shortness of breath.   Cardiovascular:  Negative for chest pain.  Gastrointestinal:  Negative for abdominal pain.  Musculoskeletal:  Positive for arthralgias.   Objective:  BP 108/65   Pulse 98   Temp 97.8 F (36.6 C)   Ht 5' 4.5" (1.638 m)   Wt 186 lb 3.2 oz (84.5 kg)   SpO2 99%   BMI 31.47 kg/m   BP Readings from Last 3 Encounters:  01/08/21 108/65  04/19/20 123/82  03/07/20 113/76    Wt Readings from Last 3 Encounters:  01/08/21 186 lb 3.2 oz (84.5 kg)  04/19/20 204 lb (92.5 kg)  03/07/20 205 lb (93 kg)     Physical Exam Constitutional:      General: She is not in acute distress.    Appearance: She is well-developed.  HENT:     Head: Normocephalic and atraumatic.  Eyes:     Conjunctiva/sclera: Conjunctivae normal.     Pupils: Pupils are equal, round, and reactive to light.  Neck:     Thyroid: No thyromegaly.  Cardiovascular:  Rate and Rhythm: Normal rate and regular rhythm.     Heart sounds: Normal heart sounds. No murmur heard. Pulmonary:     Effort: Pulmonary effort is normal. No respiratory distress.     Breath sounds: Normal breath sounds. No wheezing or rales.  Abdominal:     General: Bowel sounds are normal. There is no distension.     Palpations: Abdomen is soft.     Tenderness: There is abdominal tenderness (at right flank/hip).  Musculoskeletal:        General: Normal range of motion.     Cervical  back: Normal range of motion and neck supple.  Lymphadenopathy:     Cervical: No cervical adenopathy.  Skin:    General: Skin is warm and dry.  Neurological:     Mental Status: She is alert and oriented to person, place, and time.  Psychiatric:        Behavior: Behavior normal.        Thought Content: Thought content normal.        Judgment: Judgment normal.      Assessment & Plan:   Nancyann was seen today for medical management of chronic issues.  Diagnoses and all orders for this visit:  Type 2 diabetes mellitus without complication, without long-term current use of insulin (HCC) -     Bayer DCA Hb A1c Waived -     CBC with Differential/Platelet -     CMP14+EGFR -     Microalbumin / creatinine urine ratio  Hyperlipidemia, unspecified hyperlipidemia type -     Lipid panel  Disease of spinal cord (HCC) -     MR LUMBAR SPINE WO CONTRAST; Future  Right hip pain -     nabumetone (RELAFEN) 500 MG tablet; Take 2 tablets (1,000 mg total) by mouth 2 (two) times daily as needed.  Other orders -     Cancel: TSH + free T4 -     metFORMIN (GLUCOPHAGE) 1000 MG tablet; TAKE 1 TABLET BY MOUTH TWICE DAILY WITH  A  MEAL -     predniSONE (DELTASONE) 10 MG tablet; Take 5 daily for 3 days followed by 4,3,2 and 1 for 3 days each. -     Dulaglutide (TRULICITY) 3 UX/3.2GM SOPN; Inject 3 mg into the skin every 7 (seven) days. -     fluticasone furoate-vilanterol (BREO ELLIPTA) 100-25 MCG/INH AEPB; Inhale 1 puff by mouth once daily (Needs to be seen before next refill) -     rosuvastatin (CRESTOR) 10 MG tablet; Take 1 tablet (10 mg total) by mouth daily. For cholesterol     I have discontinued Karstyn M. Odea's aspirin EC and diphenhydrAMINE. I am also having her start on predniSONE. Additionally, I am having her maintain her albuterol, cyclobenzaprine, FreeStyle Libre 2 Sensor, FreeStyle Libre 14 Day Sensor, diltiazem, metFORMIN, Trulicity, fluticasone furoate-vilanterol, nabumetone, and  rosuvastatin.  Meds ordered this encounter  Medications   metFORMIN (GLUCOPHAGE) 1000 MG tablet    Sig: TAKE 1 TABLET BY MOUTH TWICE DAILY WITH  A  MEAL    Dispense:  180 tablet    Refill:  3   predniSONE (DELTASONE) 10 MG tablet    Sig: Take 5 daily for 3 days followed by 4,3,2 and 1 for 3 days each.    Dispense:  45 tablet    Refill:  0   Dulaglutide (TRULICITY) 3 WN/0.2VO SOPN    Sig: Inject 3 mg into the skin every 7 (seven) days.    Dispense:  2  mL    Refill:  11   fluticasone furoate-vilanterol (BREO ELLIPTA) 100-25 MCG/INH AEPB    Sig: Inhale 1 puff by mouth once daily (Needs to be seen before next refill)    Dispense:  60 each    Refill:  11   nabumetone (RELAFEN) 500 MG tablet    Sig: Take 2 tablets (1,000 mg total) by mouth 2 (two) times daily as needed.    Dispense:  120 tablet    Refill:  3   rosuvastatin (CRESTOR) 10 MG tablet    Sig: Take 1 tablet (10 mg total) by mouth daily. For cholesterol    Dispense:  90 tablet    Refill:  3     Follow-up: Return in about 3 months (around 04/10/2021).  Claretta Fraise, M.D.

## 2021-01-09 LAB — CBC WITH DIFFERENTIAL/PLATELET
Basophils Absolute: 0.1 10*3/uL (ref 0.0–0.2)
Basos: 1 %
EOS (ABSOLUTE): 0.3 10*3/uL (ref 0.0–0.4)
Eos: 3 %
Hematocrit: 41.8 % (ref 34.0–46.6)
Hemoglobin: 14 g/dL (ref 11.1–15.9)
Immature Grans (Abs): 0 10*3/uL (ref 0.0–0.1)
Immature Granulocytes: 0 %
Lymphocytes Absolute: 2 10*3/uL (ref 0.7–3.1)
Lymphs: 23 %
MCH: 30.2 pg (ref 26.6–33.0)
MCHC: 33.5 g/dL (ref 31.5–35.7)
MCV: 90 fL (ref 79–97)
Monocytes Absolute: 0.5 10*3/uL (ref 0.1–0.9)
Monocytes: 6 %
Neutrophils Absolute: 5.8 10*3/uL (ref 1.4–7.0)
Neutrophils: 67 %
Platelets: 301 10*3/uL (ref 150–450)
RBC: 4.63 x10E6/uL (ref 3.77–5.28)
RDW: 14.5 % (ref 11.7–15.4)
WBC: 8.7 10*3/uL (ref 3.4–10.8)

## 2021-01-09 LAB — BAYER DCA HB A1C WAIVED: HB A1C (BAYER DCA - WAIVED): 6.6 % (ref <7.0)

## 2021-01-09 LAB — CMP14+EGFR
ALT: 15 IU/L (ref 0–32)
AST: 12 IU/L (ref 0–40)
Albumin/Globulin Ratio: 1.9 (ref 1.2–2.2)
Albumin: 4.8 g/dL (ref 3.8–4.8)
Alkaline Phosphatase: 78 IU/L (ref 44–121)
BUN/Creatinine Ratio: 11 (ref 9–23)
BUN: 9 mg/dL (ref 6–24)
Bilirubin Total: 0.2 mg/dL (ref 0.0–1.2)
CO2: 22 mmol/L (ref 20–29)
Calcium: 9.8 mg/dL (ref 8.7–10.2)
Chloride: 101 mmol/L (ref 96–106)
Creatinine, Ser: 0.79 mg/dL (ref 0.57–1.00)
Globulin, Total: 2.5 g/dL (ref 1.5–4.5)
Glucose: 143 mg/dL — ABNORMAL HIGH (ref 65–99)
Potassium: 5.1 mmol/L (ref 3.5–5.2)
Sodium: 140 mmol/L (ref 134–144)
Total Protein: 7.3 g/dL (ref 6.0–8.5)
eGFR: 92 mL/min/{1.73_m2} (ref >59)

## 2021-01-09 LAB — LIPID PANEL
Chol/HDL Ratio: 4.6 ratio — ABNORMAL HIGH (ref 0.0–4.4)
Cholesterol, Total: 247 mg/dL — ABNORMAL HIGH (ref 100–199)
HDL: 54 mg/dL (ref >39)
LDL Chol Calc (NIH): 169 mg/dL — ABNORMAL HIGH (ref 0–99)
Triglycerides: 131 mg/dL (ref 0–149)
VLDL Cholesterol Cal: 24 mg/dL (ref 5–40)

## 2021-01-11 ENCOUNTER — Encounter: Payer: Self-pay | Admitting: *Deleted

## 2021-01-11 ENCOUNTER — Telehealth: Payer: Self-pay | Admitting: Family Medicine

## 2021-04-09 ENCOUNTER — Other Ambulatory Visit: Payer: Self-pay | Admitting: Family Medicine

## 2021-05-17 ENCOUNTER — Encounter: Payer: Self-pay | Admitting: Family Medicine

## 2021-05-23 ENCOUNTER — Telehealth: Payer: Self-pay | Admitting: Family Medicine

## 2021-05-23 MED ORDER — DILTIAZEM HCL 30 MG PO TABS: 30.0000 mg | ORAL_TABLET | Freq: Two times a day (BID) | ORAL | 0 refills | Status: DC

## 2021-05-23 NOTE — Telephone Encounter (Signed)
*  STAT* If patient is at the pharmacy, call can be transferred to refill team.   1. Which medications need to be refilled? (please list name of each medication and dose if known)  diltiazem (CARDIZEM) 30 MG tablet  2. Which pharmacy/location (including street and city if local pharmacy) is medication to be sent to?  [facility] [address] [place] - [address]  3. Do they need a 30 day or 90 day supply? 30 ds

## 2021-05-24 NOTE — Progress Notes (Signed)
Cardiology Office Note  Date: 05/25/2021   ID: Barbara Barber, DOB [birth year], MRN [id number]  PCP:  [name], MD  Cardiologist:  None Electrophysiologist:  None   Chief Complaint: 1 year follow-up  History of Present Illness: Barbara Barber is a 50 y.o. female with a history of SVT/palpitations.  She was referred by here PCP and seen by Dr. Purvis Sheffield on 11/29/2019.  She had been reportedly evaluated in the ED for dizziness and tachycardia and was found to be in SVT with heart rate of 237 bpm.  Her PCP  had started her on Toprol-XL 50 mg daily.  She had experienced episodic palpitations over the years in the past but had never experienced sustained palpitations like the event that occurred on 11/04/2019.  On the prior Monday she began experiencing palpitations lasting for approximately 6 hours.  Went to the emergency department at [facility].  She received 2 doses of IV adenosine which converted her back to her normal sinus rhythm.  She was taking her Toprol-XL at night and experiencing significant fatigue and drowsiness.  She was drinking 6 cups of coffee every morning and drinking Diet Coke during the day.  Dr. Purvis Sheffield ordered a 30-day event monitor.  He also switched her Toprol-XL to short acting diltiazem 30 mg twice a day.  A 2D echocardiogram was ordered.  At prior visit she had been doing well but still occasionally had transient short-lived palpitations which were not bothersome.  She continued Cardizem 30 mg p.o. twice daily.  No complaints of anginal or exertional symptoms.  No episodes of SVT at all.  He had cut down on the amount of coffee she was drinking in addition to decreasing caffeinated soft drinks.  She stated this had helped significantly.  She saw her PCP in June.  She was working hard on weight loss.  She was using Trulicity and diet.  She was having some hip pain on the right.  PCP ordered hemoglobin A1c, CBC, complete metabolic panel with estimated  GFR, microalbumin/creatinine urine ratio, lipid panel.  Also ordered MRI of lumbar spine secondary to right hip pain.  She is here for 1 year follow-up today.  She denies any recent issues with palpitations.  States she probably has not had any palpitations in over a year.  She states her hemoglobin A1c is improving.  Most recent hemoglobin A1c was 6.6 percent.  EKG today shows normal sinus rhythm with a rate of 85, rightward axis, nonspecific T wave abnormality.  She denies any anginal or exertional symptoms, orthostatic symptoms, CVA or TIA-like symptoms, palpitations or arrhythmias.  Denies any DOE or SOB, PND, orthopnea.  Bleeding issues, denies any claudication-like symptoms, DVT or PE-like symptoms.  She states she has lost a lot of weight over the last year or so.  She states she has recently been out of her Trulicity due to being on backorder. She continues to try to lose weight.    Past Medical History:  Diagnosis Date   Bulging lumbar disc    Diabetes mellitus without complication (HCC)    Fibromyalgia    Fibromyalgia    Hyperlipidemia    Lumbar herniated disc    X4   Migraines     Past Surgical History:  Procedure Laterality Date   APPENDECTOMY      Current Outpatient Medications  Medication Sig Dispense Refill   albuterol (VENTOLIN HFA) 108 (90 Base) MCG/ACT inhaler INHALE 2 PUFFS BY MOUTH EVERY 6 HOURS AS NEEDED FOR  WHEEZING FOR SHORTNESS OF BREATH 18 g 1   cyclobenzaprine (FLEXERIL) 10 MG tablet Take 1 tablet by mouth three times daily as needed for muscle spasm 60 tablet 5   diltiazem (CARDIZEM) 30 MG tablet Take 1 tablet (30 mg total) by mouth 2 (two) times daily. 60 tablet 0   Dulaglutide (TRULICITY) 3 MG/0.5ML SOPN Inject 3 mg into the skin every 7 (seven) days. 2 mL 11   fluticasone furoate-vilanterol (BREO ELLIPTA) 100-25 MCG/INH AEPB Inhale 1 puff by mouth once daily (Needs to be seen before next refill) 60 each 11   metFORMIN (GLUCOPHAGE) 1000 MG tablet TAKE 1  TABLET BY MOUTH TWICE DAILY WITH  A  MEAL 180 tablet 3   nabumetone (RELAFEN) 500 MG tablet Take 2 tablets (1,000 mg total) by mouth 2 (two) times daily as needed. 120 tablet 3   Continuous Blood Gluc Sensor (FREESTYLE LIBRE 14 DAY SENSOR) MISC Use to check glucose fasting, mealtime and bedtime (Patient not taking: Reported on 01/08/2021) 2 each 11   Continuous Blood Gluc Sensor (FREESTYLE LIBRE 2 SENSOR) MISC Use to check glucose fasting and before each meal. (Patient not taking: Reported on 01/08/2021) 1 each 11   predniSONE (DELTASONE) 10 MG tablet Take 5 daily for 3 days followed by 4,3,2 and 1 for 3 days each. 45 tablet 0   rosuvastatin (CRESTOR) 10 MG tablet Take 1 tablet (10 mg total) by mouth daily. For cholesterol (Patient not taking: Reported on 05/25/2021) 90 tablet 3   No current facility-administered medications for this visit.   Allergies:  Ace inhibitors and Other   Social History: The patient  reports that she has been smoking cigarettes. She has a 12.00 pack-year smoking history. She has never used smokeless tobacco. She reports that she does not drink alcohol and does not use drugs.   Family History: The patient's family history includes Alcohol abuse in her paternal uncle; Diabetes in her paternal grandmother; Heart attack in her father; Heart disease in her paternal grandmother; Lung disease in her father; Pelvic inflammatory disease in her mother; Stroke in her father and maternal grandmother; Ulcers in her father.   ROS:  Please see the history of present illness. Otherwise, complete review of systems is positive for none.  All other systems are reviewed and negative.   Physical Exam: VS:  BP 122/70   Pulse 85   Ht 5' 4.5" (1.638 m)   Wt 187 lb 3.2 oz (84.9 kg)   SpO2 97%   BMI 31.64 kg/m , BMI Body mass index is 31.64 kg/m.  Wt Readings from Last 3 Encounters:  05/25/21 187 lb 3.2 oz (84.9 kg)  01/08/21 186 lb 3.2 oz (84.5 kg)  04/19/20 204 lb (92.5 kg)     General: Patient appears comfortable at rest. Neck: Supple, no elevated JVP or carotid bruits, no thyromegaly. Lungs: Clear to auscultation, nonlabored breathing at rest. Cardiac: Regular rate and rhythm, no S3 or significant systolic murmur, no pericardial rub. Extremities: No pitting edema, distal pulses 2+. Skin: Warm and dry. Musculoskeletal: No kyphosis. Neuropsychiatric: Alert and oriented x3, affect grossly appropriate.  ECG: 05/25/2021 EKG normal sinus rhythm rate of 85, rightward axis, nonspecific T wave abnormality.  Recent Labwork: 01/08/2021: ALT 15; AST 12; BUN 9; Creatinine, Ser 0.79; Hemoglobin 14.0; Platelets 301; Potassium 5.1; Sodium 140     Component Value Date/Time   CHOL 247 (H) 01/08/2021 1021   TRIG 131 01/08/2021 1021   HDL 54 01/08/2021 1021   CHOLHDL 4.6 (H)  01/08/2021 1021   LDLCALC 169 (H) 01/08/2021 1021    Other Studies Reviewed Today:   Cardiac event monitor 12/08/2019 Patient had a cardiac event monitor placed on 12/08/2019.  Baseline sample showed sinus rhythm with heart rate of 98 bpm there was 0 serious 0 critical and 1 stable events that occurred.  There was no atrial fibrillation detected.  No bradycardia detected.  There was tachycardia.  Minimum rate during tachycardic events was 101 on 5/26 at  5:30 PM.  The average tachycardic heart rate was 111 bpm.  The maximum tachycardic rate was 148 bpm on 5/27 at 6:57 AM.  The longest period of tachycardia was 3 hours and 56 minutes on 612 at 7:48 AM him.  Overall minimum heart rate was 69.  Average heart rate 91, maximum heart rate 148.   Echo 12/01/2019 1. Left ventricular ejection fraction, by estimation, is 60 to 65%. The left ventricle has normal function. The left ventricle has no regional wall motion abnormalities. There is mild concentric left ventricular hypertrophy. Left ventricular diastolic parameters are consistent with Grade I diastolic dysfunction (impaired relaxation). 2. Right ventricular  systolic function is normal. The right ventricular size is normal. 3. The mitral valve is grossly normal. No evidence of mitral valve regurgitation. 4. The aortic valve has an indeterminant number of cusps. Aortic valve regurgitation is not visualized. No aortic stenosis is present. 5. The inferior vena cava is normal in size with greater than 50% respiratory variability, suggesting right atrial pressure of 3 mmHg.   Assessment and Plan:   1. SVT (supraventricular tachycardia) (HCC) She states she has probably not had any palpitations in over a year.  Continues to take Cardizem 30 mg p.o. daily.  Advised her if she has any breakthrough palpitations after taking these dosages she may take an extra Cardizem if needed for palpitations.  2.  Hyperlipidemia Lipid panel on 01/08/2021 by PCP: TC 247, TG 131, HDL 54, LDL 169.  She was started on Crestor 10 mg by PCP.  Continue Crestor 10 mg daily.   3.  Type 2 diabetes Fasting glucose was 143 on PCP labs 01/08/2021.  Hemoglobin A1c 6.6.  She is continuing Trulicity.  She states Trulicity is on backorder and she has not had it in a week.  Medication Adjustments/Labs and Tests Ordered: Current medicines are reviewed at length with the patient today.  Concerns regarding medicines are outlined above.   Disposition: Follow-up with Dr. Wyline Mood or APP 1 year  Signed, Rennis Harding, NP 05/25/2021 9:05 AM    [facility] at [facility] [address] [place], [place], [place] [address] Phone: [phone]; Fax: [phone]

## 2021-05-25 ENCOUNTER — Ambulatory Visit: Payer: BC Managed Care – PPO | Admitting: Family Medicine

## 2021-05-25 ENCOUNTER — Encounter: Payer: Self-pay | Admitting: Family Medicine

## 2021-05-25 ENCOUNTER — Other Ambulatory Visit: Payer: Self-pay

## 2021-05-25 VITALS — BP 122/70 | HR 85 | Ht 64.5 in | Wt 187.2 lb

## 2021-05-25 DIAGNOSIS — E782 Mixed hyperlipidemia: Secondary | ICD-10-CM | POA: Diagnosis not present

## 2021-05-25 DIAGNOSIS — E119 Type 2 diabetes mellitus without complications: Secondary | ICD-10-CM | POA: Diagnosis not present

## 2021-05-25 DIAGNOSIS — I471 Supraventricular tachycardia: Secondary | ICD-10-CM

## 2021-05-25 NOTE — Patient Instructions (Signed)
Medication Instructions:  Your physician recommends that you continue on your current medications as directed. Please refer to the Current Medication list given to you today.  *If you need a refill on your cardiac medications before your next appointment, please call your pharmacy*   Lab Work: None If you have labs (blood work) drawn today and your tests are completely normal, you will receive your results only by: MyChart Message (if you have MyChart) OR A paper copy in the mail If you have any lab test that is abnormal or we need to change your treatment, we will call you to review the results.   Testing/Procedures: None   Follow-Up: At [facility], you and your health needs are our priority.  As part of our continuing mission to provide you with exceptional heart care, we have created designated Provider Care Teams.  These Care Teams include your primary Cardiologist (physician) and Advanced Practice Providers (APPs -  Physician Assistants and Nurse Practitioners) who all work together to provide you with the care you need, when you need it.  We recommend signing up for the patient portal called "MyChart".  Sign up information is provided on this After Visit Summary.  MyChart is used to connect with patients for Virtual Visits (Telemedicine).  Patients are able to view lab/test results, encounter notes, upcoming appointments, etc.  Non-urgent messages can be sent to your provider as well.   To learn more about what you can do with MyChart, go to [email or web address].    Your next appointment:   1 year(s)  The format for your next appointment:   In Person  Provider:   Establish with MD :1}    Other Instructions

## 2021-06-09 ENCOUNTER — Other Ambulatory Visit: Payer: Self-pay | Admitting: Family Medicine

## 2021-06-09 DIAGNOSIS — J4530 Mild persistent asthma, uncomplicated: Secondary | ICD-10-CM

## 2021-06-23 ENCOUNTER — Other Ambulatory Visit: Payer: Self-pay | Admitting: Family Medicine

## 2021-06-23 DIAGNOSIS — M5441 Lumbago with sciatica, right side: Secondary | ICD-10-CM

## 2021-06-23 DIAGNOSIS — M5442 Lumbago with sciatica, left side: Secondary | ICD-10-CM

## 2021-06-25 ENCOUNTER — Other Ambulatory Visit: Payer: Self-pay | Admitting: *Deleted

## 2021-06-25 ENCOUNTER — Telehealth: Payer: Self-pay | Admitting: *Deleted

## 2021-06-25 MED ORDER — DILTIAZEM HCL 30 MG PO TABS: 30.0000 mg | ORAL_TABLET | Freq: Two times a day (BID) | ORAL | 3 refills | Status: DC

## 2021-06-25 NOTE — Telephone Encounter (Signed)
Received fax requesting refill - Diltiazem 30mg  twice a day - [facility]   Only seen by & prev SK pt.

## 2021-06-25 NOTE — Telephone Encounter (Signed)
Done

## 2021-06-25 NOTE — Telephone Encounter (Signed)
   She just saw Mardelle Matte in 05/2021 and was doing well. Can provide 90-day supply with refills for the year.   Signed, Ellsworth Lennox, PA-C 06/25/2021, 1:37 PM

## 2021-08-07 ENCOUNTER — Ambulatory Visit: Payer: BC Managed Care – PPO | Admitting: Nurse Practitioner

## 2021-08-07 ENCOUNTER — Encounter: Payer: Self-pay | Admitting: Nurse Practitioner

## 2021-08-07 DIAGNOSIS — J069 Acute upper respiratory infection, unspecified: Secondary | ICD-10-CM

## 2021-08-07 MED ORDER — BENZONATATE 100 MG PO CAPS: 100.0000 mg | ORAL_CAPSULE | Freq: Three times a day (TID) | ORAL | 0 refills | Status: DC | PRN

## 2021-08-07 MED ORDER — PREDNISONE 20 MG PO TABS: 40.0000 mg | ORAL_TABLET | Freq: Every day | ORAL | 0 refills | Status: AC

## 2021-08-07 MED ORDER — AZITHROMYCIN 250 MG PO TABS
ORAL_TABLET | ORAL | 0 refills | Status: DC
Start: 2021-08-07 — End: 2021-08-24

## 2021-08-07 MED ORDER — ALBUTEROL SULFATE HFA 108 (90 BASE) MCG/ACT IN AERS: 2.0000 | INHALATION_SPRAY | Freq: Four times a day (QID) | RESPIRATORY_TRACT | 0 refills | Status: DC | PRN

## 2021-08-07 NOTE — Patient Instructions (Signed)

## 2021-08-07 NOTE — Progress Notes (Signed)
Virtual Visit  Note Due to COVID-19 pandemic this visit was conducted virtually. This visit type was conducted due to national recommendations for restrictions regarding the COVID-19 Pandemic (e.g. social distancing, sheltering in place) in an effort to limit this patient's exposure and mitigate transmission in our community. All issues noted in this document were discussed and addressed.  A physical exam was not performed with this format.  I connected with Barbara Barber on 08/07/21 at 1:36 by telephone and verified that I am speaking with the correct person using two identifiers. Barbara Barber is currently located at work and no one is currently with her during visit. The provider, Mary-Margaret Daphine Deutscher, FNP is located in their office at time of visit.  I discussed the limitations, risks, security and privacy concerns of performing an evaluation and management service by telephone and the availability of in person appointments. I also discussed with the patient that there may be a patient responsible charge related to this service. The patient expressed understanding and agreed to proceed.   History and Present Illness:  URI  This is a new problem. The current episode started 1 to 4 weeks ago. The problem has been gradually worsening. There has been no fever. Associated symptoms include congestion, coughing, headaches, rhinorrhea, sinus pain, sneezing and a sore throat. She has tried decongestant for the symptoms. The treatment provided mild relief.  O2 sat hovering around 92. Hard to take a deep breath.Covid test has been negative    Review of Systems  HENT:  Positive for congestion, rhinorrhea, sinus pain, sneezing and sore throat.   Respiratory:  Positive for cough.   Neurological:  Positive for headaches.    Observations/Objective: Alert and oriented- answers all questions appropriately No distress Raspy voice Deep cough duirng visit  Assessment and Plan: Barbara Barber  in today with chief complaint of URI   1. URI with cough and congestion 1. Take meds as prescribed 2. Use a cool mist humidifier especially during the winter months and when heat has been humid. 3. Use saline nose sprays frequently 4. Saline irrigations of the nose can be very helpful if done frequently.  * 4X daily for 1 week*  * Use of a nettie pot can be helpful with this. Follow directions with this* 5. Drink plenty of fluids 6. Keep thermostat turn down low 7.For any cough or congestion- tessalon perles 8. For fever or aces or pains- take tylenol or ibuprofen appropriate for age and weight.  * for fevers greater than 101 orally you may alternate ibuprofen and tylenol every  3 hours.    - albuterol (VENTOLIN HFA) 108 (90 Base) MCG/ACT inhaler; Inhale 2 puffs into the lungs every 6 (six) hours as needed for wheezing or shortness of breath.  Dispense: 8 g; Refill: 0 - azithromycin (ZITHROMAX Z-PAK) 250 MG tablet; As directed  Dispense: 6 tablet; Refill: 0 - predniSONE (DELTASONE) 20 MG tablet; Take 2 tablets (40 mg total) by mouth daily with breakfast for 5 days. 2 po daily for 5 days  Dispense: 10 tablet; Refill: 0 - benzonatate (TESSALON PERLES) 100 MG capsule; Take 1 capsule (100 mg total) by mouth 3 (three) times daily as needed.  Dispense: 20 capsule; Refill: 0    Follow Up Instructions: prn    I discussed the assessment and treatment plan with the patient. The patient was provided an opportunity to ask questions and all were answered. The patient agreed with the plan and demonstrated an understanding of the  instructions.   The patient was advised to call back or seek an in-person evaluation if the symptoms worsen or if the condition fails to improve as anticipated.  The above assessment and management plan was discussed with the patient. The patient verbalized understanding of and has agreed to the management plan. Patient is aware to call the clinic if symptoms persist or  worsen. Patient is aware when to return to the clinic for a follow-up visit. Patient educated on when it is appropriate to go to the emergency department.   Time call ended:  1:50  I provided 13 minutes of  non face-to-face time during this encounter.    Mary-Margaret Daphine Deutscher, FNP

## 2021-08-24 ENCOUNTER — Encounter: Payer: Self-pay | Admitting: Family Medicine

## 2021-08-24 ENCOUNTER — Ambulatory Visit: Payer: BC Managed Care – PPO | Admitting: Family Medicine

## 2021-08-24 VITALS — BP 123/77 | HR 86 | Temp 95.5°F | Ht 64.5 in | Wt 195.0 lb

## 2021-08-24 DIAGNOSIS — B029 Zoster without complications: Secondary | ICD-10-CM | POA: Diagnosis not present

## 2021-08-24 MED ORDER — GABAPENTIN 100 MG PO CAPS: 100.0000 mg | ORAL_CAPSULE | Freq: Two times a day (BID) | ORAL | 0 refills | Status: DC | PRN

## 2021-08-24 MED ORDER — VALACYCLOVIR HCL 1 G PO TABS: 1000.0000 mg | ORAL_TABLET | Freq: Three times a day (TID) | ORAL | 0 refills | Status: AC

## 2021-08-24 NOTE — Progress Notes (Signed)
Assessment & Plan:  1. Herpes zoster without complication Education provided on shingles.  Encouraged to keep covered and perform frequent handwashing.  Discussed she is contagious until the rash crusts over. - valACYclovir (VALTREX) 1000 MG tablet; Take 1 tablet (1,000 mg total) by mouth 3 (three) times daily for 7 days.  Dispense: 21 tablet; Refill: 0 - gabapentin (NEURONTIN) 100 MG capsule; Take 1 capsule (100 mg total) by mouth 2 (two) times daily as needed.  Dispense: 30 capsule; Refill: 0   Follow up plan: Return if symptoms worsen or fail to improve.  [name], MSN, APRN, FNP-C [facility]  Subjective:   Patient ID: Barbara Barber, female    DOB: [birth year], 51 y.o.   MRN: [id number]  HPI: Barbara Barber is a 51 y.o. female presenting on 08/24/2021 for rash  (Under left breast that is painful and oozing since Saturday. )  Patient reports a rash under her left breast that started 6 days ago.  She reports that initially it was pruritic and then the bumps came and it started using.  Now it is burning and very painful.  Not present anywhere else on her body.   ROS: Negative unless specifically indicated above in HPI.   Relevant past medical history reviewed and updated as indicated.   Allergies and medications reviewed and updated.   Current Outpatient Medications:    albuterol (VENTOLIN HFA) 108 (90 Base) MCG/ACT inhaler, Inhale 2 puffs into the lungs every 6 (six) hours as needed for wheezing or shortness of breath., Disp: 8 g, Rfl: 0   diltiazem (CARDIZEM) 30 MG tablet, Take 1 tablet (30 mg total) by mouth 2 (two) times daily., Disp: 180 tablet, Rfl: 3   Dulaglutide (TRULICITY) 3 MG/0.5ML SOPN, Inject 3 mg into the skin every 7 (seven) days., Disp: 2 mL, Rfl: 11   fluticasone furoate-vilanterol (BREO ELLIPTA) 100-25 MCG/INH AEPB, Inhale 1 puff by mouth once daily (Needs to be seen before next refill), Disp: 60 each, Rfl: 11   metFORMIN  (GLUCOPHAGE) 1000 MG tablet, TAKE 1 TABLET BY MOUTH TWICE DAILY WITH  A  MEAL, Disp: 180 tablet, Rfl: 3   nabumetone (RELAFEN) 500 MG tablet, Take 2 tablets (1,000 mg total) by mouth 2 (two) times daily as needed., Disp: 120 tablet, Rfl: 3   rosuvastatin (CRESTOR) 10 MG tablet, Take 1 tablet (10 mg total) by mouth daily. For cholesterol (Patient not taking: Reported on 05/25/2021), Disp: 90 tablet, Rfl: 3  Allergies  Allergen Reactions   Ace Inhibitors Cough    lisinopril   Other Hives    Talactin, (muscle relaxer), for TMJ given when pt was in [place]. High.- Hives, facial swelling CT dye, given the 90's caused throat swelling.     Objective:   BP 123/77    Pulse 86    Temp (!) 95.5 F (35.3 C) (Temporal)    Ht 5' 4.5" (1.638 m)    Wt 195 lb (88.5 kg)    BMI 32.95 kg/m    Physical Exam Vitals reviewed.  Constitutional:      General: She is not in acute distress.    Appearance: Normal appearance. She is not ill-appearing, toxic-appearing or diaphoretic.  HENT:     Head: Normocephalic and atraumatic.  Eyes:     General: No scleral icterus.       Right eye: No discharge.        Left eye: No discharge.     Conjunctiva/sclera: Conjunctivae normal.  Cardiovascular:  Rate and Rhythm: Normal rate.  Pulmonary:     Effort: Pulmonary effort is normal. No respiratory distress.  Musculoskeletal:        General: Normal range of motion.     Cervical back: Normal range of motion.  Skin:    General: Skin is warm and dry.     Capillary Refill: Capillary refill takes less than 2 seconds.     Findings: Rash present. Rash is vesicular (under left breast - T4 dermatome).  Neurological:     General: No focal deficit present.     Mental Status: She is alert and oriented to person, place, and time. Mental status is at baseline.  Psychiatric:        Mood and Affect: Mood normal.        Behavior: Behavior normal.        Thought Content: Thought content normal.        Judgment: Judgment normal.

## 2021-08-26 ENCOUNTER — Encounter: Payer: Self-pay | Admitting: Family Medicine

## 2021-09-21 ENCOUNTER — Other Ambulatory Visit: Payer: Self-pay | Admitting: Nurse Practitioner

## 2021-09-21 DIAGNOSIS — J069 Acute upper respiratory infection, unspecified: Secondary | ICD-10-CM

## 2021-10-19 ENCOUNTER — Other Ambulatory Visit: Payer: Self-pay | Admitting: Family Medicine

## 2021-10-19 DIAGNOSIS — J069 Acute upper respiratory infection, unspecified: Secondary | ICD-10-CM

## 2021-10-22 ENCOUNTER — Encounter: Payer: Self-pay | Admitting: Family Medicine

## 2021-10-22 NOTE — Telephone Encounter (Signed)
LMTCB TO SCHEDULE APPT LETTER MAILED 

## 2021-10-22 NOTE — Telephone Encounter (Signed)
Refill given on 09/22/2021 - ntbs  ?

## 2021-11-05 NOTE — Telephone Encounter (Signed)
Pt. Will need to be seen ?

## 2021-11-13 ENCOUNTER — Telehealth: Payer: Self-pay | Admitting: Family Medicine

## 2021-11-13 NOTE — Telephone Encounter (Signed)
Okay 

## 2021-11-14 ENCOUNTER — Encounter: Payer: Self-pay | Admitting: Nurse Practitioner

## 2021-11-14 ENCOUNTER — Ambulatory Visit: Payer: BC Managed Care – PPO | Admitting: Nurse Practitioner

## 2021-11-14 VITALS — BP 108/67 | HR 82 | Temp 98.6°F | Resp 18 | Ht 64.0 in | Wt 199.0 lb

## 2021-11-14 DIAGNOSIS — M546 Pain in thoracic spine: Secondary | ICD-10-CM | POA: Diagnosis not present

## 2021-11-14 DIAGNOSIS — M5441 Lumbago with sciatica, right side: Secondary | ICD-10-CM | POA: Diagnosis not present

## 2021-11-14 DIAGNOSIS — E119 Type 2 diabetes mellitus without complications: Secondary | ICD-10-CM | POA: Diagnosis not present

## 2021-11-14 DIAGNOSIS — J069 Acute upper respiratory infection, unspecified: Secondary | ICD-10-CM | POA: Diagnosis not present

## 2021-11-14 DIAGNOSIS — G8929 Other chronic pain: Secondary | ICD-10-CM

## 2021-11-14 LAB — BAYER DCA HB A1C WAIVED: HB A1C (BAYER DCA - WAIVED): 6.2 % — ABNORMAL HIGH (ref 4.8–5.6)

## 2021-11-14 MED ORDER — CYCLOBENZAPRINE HCL 5 MG PO TABS: 5.0000 mg | ORAL_TABLET | Freq: Three times a day (TID) | ORAL | 1 refills | Status: DC | PRN

## 2021-11-14 MED ORDER — ALBUTEROL SULFATE HFA 108 (90 BASE) MCG/ACT IN AERS: 2.0000 | INHALATION_SPRAY | Freq: Four times a day (QID) | RESPIRATORY_TRACT | 2 refills | Status: DC | PRN

## 2021-11-14 NOTE — Telephone Encounter (Signed)
Okay 

## 2021-11-14 NOTE — Assessment & Plan Note (Signed)
Completed A1c-6.2% continue  metformin 1000 mg tablet by mouth daily. ?Follow-up in 3-6 months. ?

## 2021-11-14 NOTE — Progress Notes (Signed)
? ?New Patient Note ? ?RE: Barbara Barber MRN: [id number] DOB: 02/10/1971 ?Date of Office Visit: 11/14/2021 ? ?Chief Complaint: Medication Refill (Albuterol Sulfate , ) and Hip Pain (This has been going on for about a year now ) ? ?History of Present Illness: ?Hip Pain: Patient complains of right hip pain. Onset of the symptoms was several days ago. Inciting event: none. Current symptoms include is aggravated by walking. Associated symptoms: none. Aggravating symptoms: squatting, standing, and walking. Patient's overall course: intermittent. Patient has had no prior hip problems. Previous visits for this problem: multiple, this is a longstanding diagnosis. Last seen a few months ago by the patient's PCP. Evaluation to date: plain films, which were normal.  Treatment to date:  Muscle relaxants. .  ? ?Back Pain ? ?She reports recurrent back pain. There was not an injury that may have caused the pain. The most recent episode started several months ago and is staying constant. The pain is located in the waist, across the lower back, or radiating to right leg(s)without radiation. It is described as aching and soreness, is moderate in intensity, occurring intermittently. Symptoms are worse in the: morning, mid-day, afternoon, evening  ?Aggravating factors: walking Relieving factors: none.  ?She has tried application of heat and oral steroids with significant relief.  ? ?Associated symptoms: ?No abdominal pain No bowel incontinence  ?No chest pain No dysuria   ?No fever No headaches  ?No joint pains No pelvic pain  ?No weakness in leg ? No tingling in lower extremities  ?No urinary incontinence No weight loss  ? ? ? ?The patient presents with history of type 2 diabetes mellitus without complications. Patient was diagnosed in 2017. Compliance with treatment has been good; the patient takes medication as directed , maintains a diabetic diet and an exercise regimen , follows up as directed , and is keeping a glucose diary.  Sugars runs : patient did not bring her log to clinic today. Patient specifically denies associated symptoms, including blurred vision, fatigue, polydipsia, polyphagia and polyuria . Patient denies hypoglycemia. In regard to preventative care, the patient performs foot self-exams daily and next ophthalmology exam will be schedule ? ? ? ? ?Assessment and Plan: ?Barbara Barber is a 51 y.o. female with: ?DM2 (diabetes mellitus, type 2) ([place]) ?Completed A1c-6.2% continue  metformin 1000 mg tablet by mouth daily. ?Follow-up in 3-6 months. ? ?Chronic thoracic back pain ?Back pain uncontrolled with right-sided sciatica.  Flexeril 5 mg tablet by mouth as needed for muscle spasm.  Education provided to patient printed handouts given.  Follow-up with worsening unresolved symptoms. ? ?Return in about 3 months (around 02/14/2022), or if symptoms worsen or fail to improve, for CDM/  schedule physical /labs/Pap. ? ? ?Diagnostics: ? ? ?Past Medical History: ?Patient Active Problem List  ? Diagnosis Date Noted  ? Chronic bilateral low back pain with bilateral sciatica 04/23/2017  ? DM2 (diabetes mellitus, type 2) ([place]) 03/22/2016  ? Fibromyalgia 03/22/2016  ? Morbid obesity ([place]) 03/22/2016  ? Chronic thoracic back pain 03/22/2016  ? Routine gynecological examination 01/27/2014  ? ?Past Medical History:  ?Diagnosis Date  ? Asthma   ? Bulging lumbar disc   ? COPD (chronic obstructive pulmonary disease) ([place])   ? Diabetes mellitus without complication ([place])   ? Fibromyalgia   ? Fibromyalgia   ? Hyperlipidemia   ? Lumbar herniated disc   ? X4  ? Migraines   ? ?Past Surgical History: ?Past Surgical History:  ?Procedure Laterality Date  ? APPENDECTOMY    ? ?  Medication List:  ?Current Outpatient Medications  ?Medication Sig Dispense Refill  ? cyclobenzaprine (FLEXERIL) 5 MG tablet Take 1 tablet (5 mg total) by mouth 3 (three) times daily as needed for muscle spasms. 30 tablet 1  ? Dulaglutide (TRULICITY) 3 0000000 SOPN Inject 3 mg into the skin  every 7 (seven) days. 2 mL 11  ? fluticasone furoate-vilanterol (BREO ELLIPTA) 100-25 MCG/INH AEPB Inhale 1 puff by mouth once daily (Needs to be seen before next refill) 60 each 11  ? gabapentin (NEURONTIN) 100 MG capsule Take 1 capsule (100 mg total) by mouth 2 (two) times daily as needed. 30 capsule 0  ? metFORMIN (GLUCOPHAGE) 1000 MG tablet TAKE 1 TABLET BY MOUTH TWICE DAILY WITH  A  MEAL 180 tablet 3  ? nabumetone (RELAFEN) 500 MG tablet Take 2 tablets (1,000 mg total) by mouth 2 (two) times daily as needed. 120 tablet 3  ? albuterol (VENTOLIN HFA) 108 (90 Base) MCG/ACT inhaler Inhale 2 puffs into the lungs every 6 (six) hours as needed for wheezing or shortness of breath. (NEEDS TO BE SEEN BEFORE NEXT REFILL) 18 g 2  ? diltiazem (CARDIZEM) 30 MG tablet Take 1 tablet (30 mg total) by mouth 2 (two) times daily. 180 tablet 3  ? ?No current facility-administered medications for this visit.  ? ?Allergies: ?Allergies  ?Allergen Reactions  ? Ace Inhibitors Cough  ?  lisinopril  ? Other Hives  ?  Talactin, (muscle relaxer), for TMJ given when pt was in [place]. High.- Hives, facial swelling ?CT dye, given the 90's caused throat swelling. ?  ? ?Social History: ?Social History  ? ?Socioeconomic History  ? Marital status: Married  ?  Spouse name: Not on file  ? Number of children: Not on file  ? Years of education: Not on file  ? Highest education level: Not on file  ?Occupational History  ? Not on file  ?Tobacco Use  ? Smoking status: Every Day  ?  Packs/day: 0.50  ?  Years: 24.00  ?  Pack years: 12.00  ?  Types: Cigarettes  ? Smokeless tobacco: Never  ? Tobacco comments:  ?  1 pack per day   ?Vaping Use  ? Vaping Use: Some days  ?Substance and Sexual Activity  ? Alcohol use: No  ? Drug use: No  ? Sexual activity: Yes  ?  Birth control/protection: None  ?Other Topics Concern  ? Not on file  ?Social History Narrative  ? Not on file  ? ?Social Determinants of Health  ? ?Financial Resource Strain: Not on file  ?Food Insecurity:  Not on file  ?Transportation Needs: Not on file  ?Physical Activity: Not on file  ?Stress: Not on file  ?Social Connections: Not on file  ? ? ? ? ? ?Family History: ?Family History  ?Problem Relation Age of Onset  ? Pelvic inflammatory disease Mother   ?     had to have Hysterectomy at 22  ? Ulcers Father   ? Heart attack Father   ? Stroke Father   ? Lung disease Father   ? Alcohol abuse Paternal Uncle   ? Stroke Maternal Grandmother   ? Heart disease Paternal Grandmother   ? Diabetes Paternal Grandmother   ? ?     ? ?Review of Systems  ?Constitutional: Negative.   ?HENT: Negative.    ?Eyes: Negative.   ?Respiratory: Negative.    ?Gastrointestinal: Negative.   ?Musculoskeletal:  Positive for back pain.  ?Skin: Negative.   ?Neurological: Negative.   ?  All other systems reviewed and are negative. ?Objective: ?BP 108/67 (BP Location: Left Arm, Patient Position: Sitting, Cuff Size: Normal)   Pulse 82   Temp 98.6 ?F (37 ?C) (Oral)   Resp 18   Ht 5\' 4"  (1.626 m)   Wt 199 lb (90.3 kg)   SpO2 96%   BMI 34.16 kg/m?  ?Body mass index is 34.16 kg/m?Marland Kitchen ?Physical Exam ?Vitals and nursing note reviewed.  ?Constitutional:   ?   Appearance: Normal appearance.  ?HENT:  ?   Right Ear: External ear normal.  ?   Left Ear: External ear normal.  ?   Nose: Nose normal.  ?   Mouth/Throat:  ?   Mouth: Mucous membranes are moist.  ?   Pharynx: Oropharynx is clear.  ?Cardiovascular:  ?   Rate and Rhythm: Normal rate and regular rhythm.  ?   Pulses: Normal pulses.  ?   Heart sounds: Normal heart sounds.  ?Abdominal:  ?   General: Bowel sounds are normal.  ?Musculoskeletal:  ?   Cervical back: Normal range of motion.  ?   Lumbar back: Tenderness present. Decreased range of motion.  ?     Back: ? ?   Comments: Back pain, right sided sciatica  ?Skin: ?   General: Skin is warm.  ?   Findings: No rash.  ?Neurological:  ?   General: No focal deficit present.  ?   Mental Status: She is alert and oriented to person, place, and time.   ?Psychiatric:     ?   Behavior: Behavior normal.  ? ?The plan was reviewed with the patient/family, and all questions/concerned were addressed. ? ?It was my pleasure to see Barbara Barber today and participate in her care. Plea

## 2021-11-14 NOTE — Patient Instructions (Signed)
Acute Back Pain, Adult Acute back pain is sudden and usually short-lived. It is often caused by an injury to the muscles and tissues in the back. The injury may result from: A muscle, tendon, or ligament getting overstretched or torn. Ligaments are tissues that connect bones to each other. Lifting something improperly can cause a back strain. Wear and tear (degeneration) of the spinal disks. Spinal disks are circular tissue that provide cushioning between the bones of the spine (vertebrae). Twisting motions, such as while playing sports or doing yard work. A hit to the back. Arthritis. You may have a physical exam, lab tests, and imaging tests to find the cause of your pain. Acute back pain usually goes away with rest and home care. Follow these instructions at home: Managing pain, stiffness, and swelling Take over-the-counter and prescription medicines only as told by your health care provider. Treatment may include medicines for pain and inflammation that are taken by mouth or applied to the skin, or muscle relaxants. Your health care provider may recommend applying ice during the first 24-48 hours after your pain starts. To do this: Put ice in a plastic bag. Place a towel between your skin and the bag. Leave the ice on for 20 minutes, 2-3 times a day. Remove the ice if your skin turns bright red. This is very important. If you cannot feel pain, heat, or cold, you have a greater risk of damage to the area. If directed, apply heat to the affected area as often as told by your health care provider. Use the heat source that your health care provider recommends, such as a moist heat pack or a heating pad. Place a towel between your skin and the heat source. Leave the heat on for 20-30 minutes. Remove the heat if your skin turns bright red. This is especially important if you are unable to feel pain, heat, or cold. You have a greater risk of getting burned. Activity  Do not stay in bed. Staying in  bed for more than 1-2 days can delay your recovery. Sit up and stand up straight. Avoid leaning forward when you sit or hunching over when you stand. If you work at a desk, sit close to it so you do not need to lean over. Keep your chin tucked in. Keep your neck drawn back, and keep your elbows bent at a 90-degree angle (right angle). Sit high and close to the steering wheel when you drive. Add lower back (lumbar) support to your car seat, if needed. Take short walks on even surfaces as soon as you are able. Try to increase the length of time you walk each day. Do not sit, drive, or stand in one place for more than 30 minutes at a time. Sitting or standing for long periods of time can put stress on your back. Do not drive or use heavy machinery while taking prescription pain medicine. Use proper lifting techniques. When you bend and lift, use positions that put less stress on your back: Bend your knees. Keep the load close to your body. Avoid twisting. Exercise regularly as told by your health care provider. Exercising helps your back heal faster and helps prevent back injuries by keeping muscles strong and flexible. Work with a physical therapist to make a safe exercise program, as recommended by your health care provider. Do any exercises as told by your physical therapist. Lifestyle Maintain a healthy weight. Extra weight puts stress on your back and makes it difficult to have good   posture. Avoid activities or situations that make you feel anxious or stressed. Stress and anxiety increase muscle tension and can make back pain worse. Learn ways to manage anxiety and stress, such as through exercise. General instructions Sleep on a firm mattress in a comfortable position. Try lying on your side with your knees slightly bent. If you lie on your back, put a pillow under your knees. Keep your head and neck in a straight line with your spine (neutral position) when using electronic equipment like  smartphones or pads. To do this: Raise your smartphone or pad to look at it instead of bending your head or neck to look down. Put the smartphone or pad at the level of your face while looking at the screen. Follow your treatment plan as told by your health care provider. This may include: Cognitive or behavioral therapy. Acupuncture or massage therapy. Meditation or yoga. Contact a health care provider if: You have pain that is not relieved with rest or medicine. You have increasing pain going down into your legs or buttocks. Your pain does not improve after 2 weeks. You have pain at night. You lose weight without trying. You have a fever or chills. You develop nausea or vomiting. You develop abdominal pain. Get help right away if: You develop new bowel or bladder control problems. You have unusual weakness or numbness in your arms or legs. You feel faint. These symptoms may represent a serious problem that is an emergency. Do not wait to see if the symptoms will go away. Get medical help right away. Call your local emergency services (911 in the [place] Do not drive yourself to the hospital. Summary Acute back pain is sudden and usually short-lived. Use proper lifting techniques. When you bend and lift, use positions that put less stress on your back. Take over-the-counter and prescription medicines only as told by your health care provider, and apply heat or ice as told. This information is not intended to replace advice given to you by your health care provider. Make sure you discuss any questions you have with your health care provider. Document Revised: 09/22/2020 Document Reviewed: 09/22/2020 Elsevier Patient Education  2023 [employer] Inc.  

## 2021-11-14 NOTE — Telephone Encounter (Signed)
Pt aware pcp changed - appt made for today  ?

## 2021-11-14 NOTE — Assessment & Plan Note (Signed)
Back pain uncontrolled with right-sided sciatica.  Flexeril 5 mg tablet by mouth as needed for muscle spasm.  Education provided to patient printed handouts given.  Follow-up with worsening unresolved symptoms. ?

## 2022-01-01 IMAGING — DX DG HIP (WITH OR WITHOUT PELVIS) 2-3V*R*
3 series · 3 of 3 positions shown · non-contrast
Comparison: None.

CLINICAL DATA: Right hip pain.  No reported injury.

EXAM:
DG HIP (WITH OR WITHOUT PELVIS) 2-3V RIGHT

[pelvis ap]
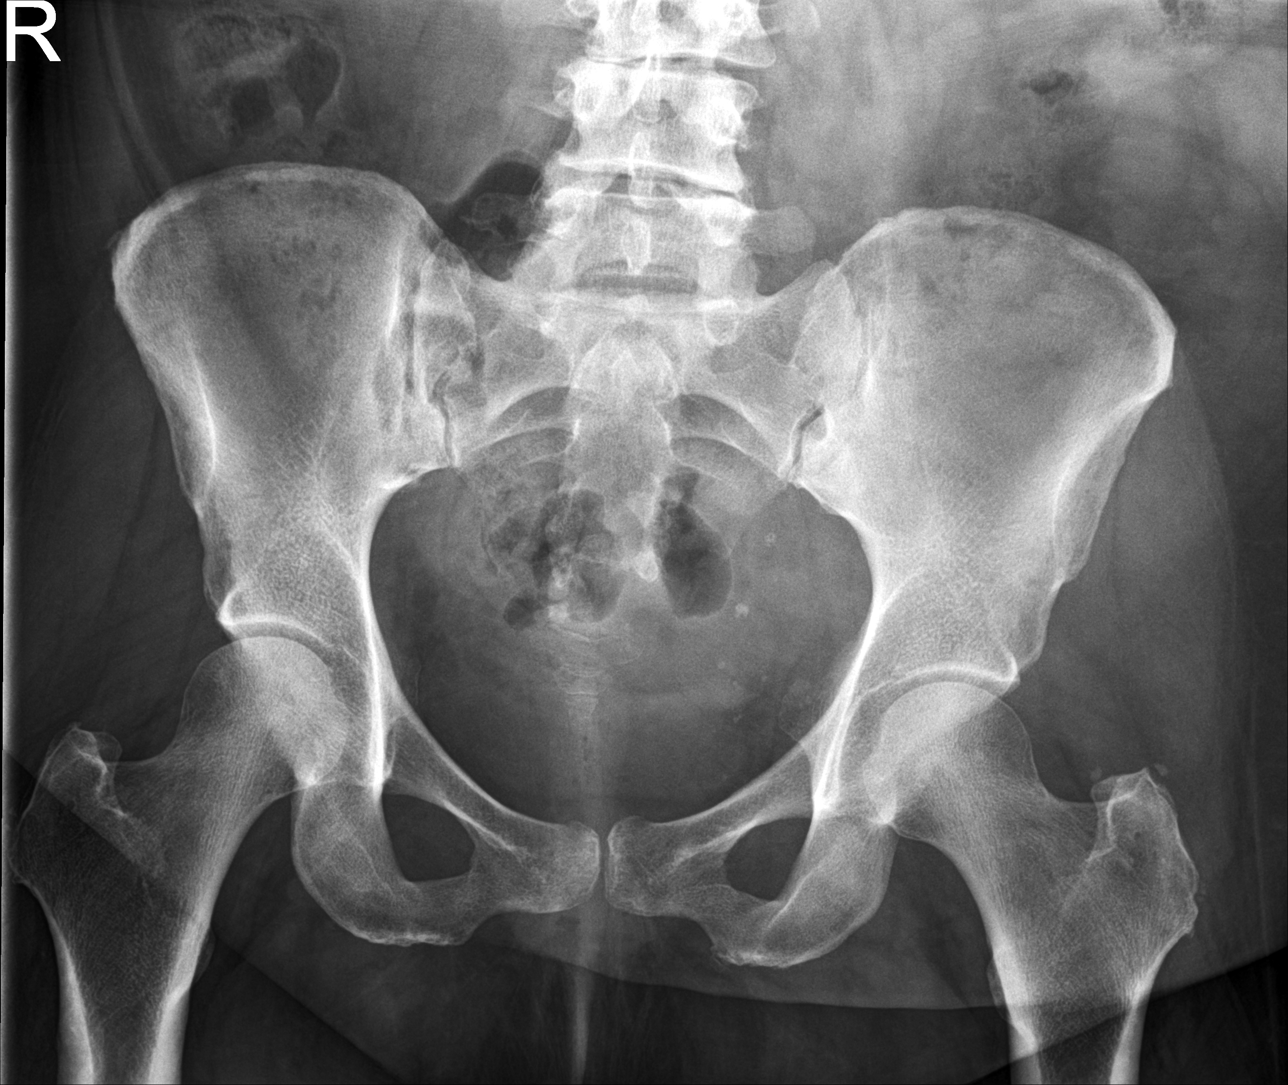

[hip ap]
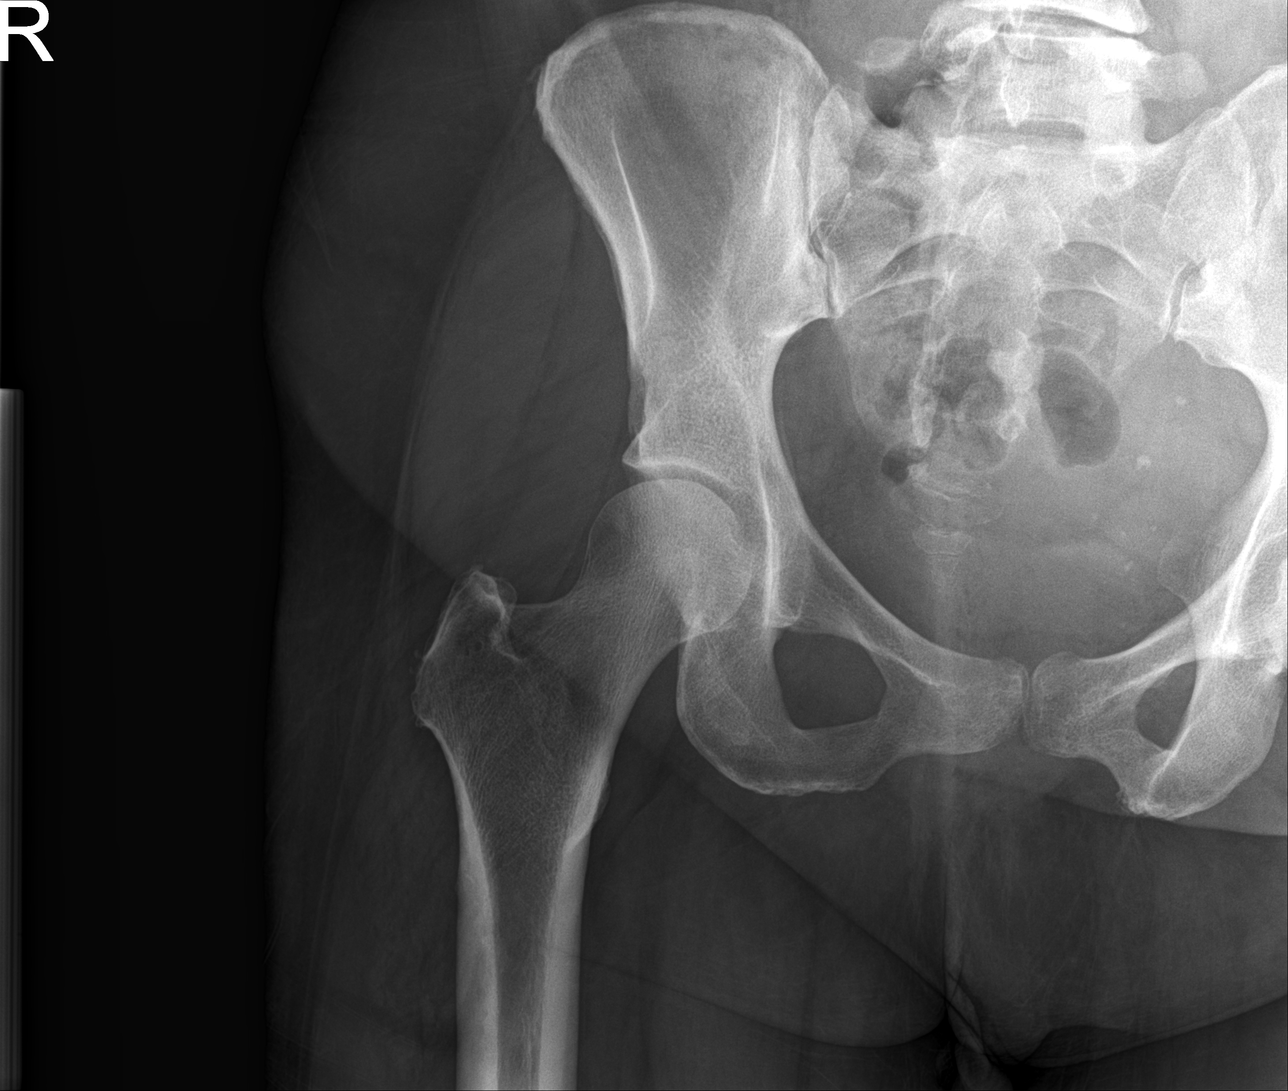

[hip lat]
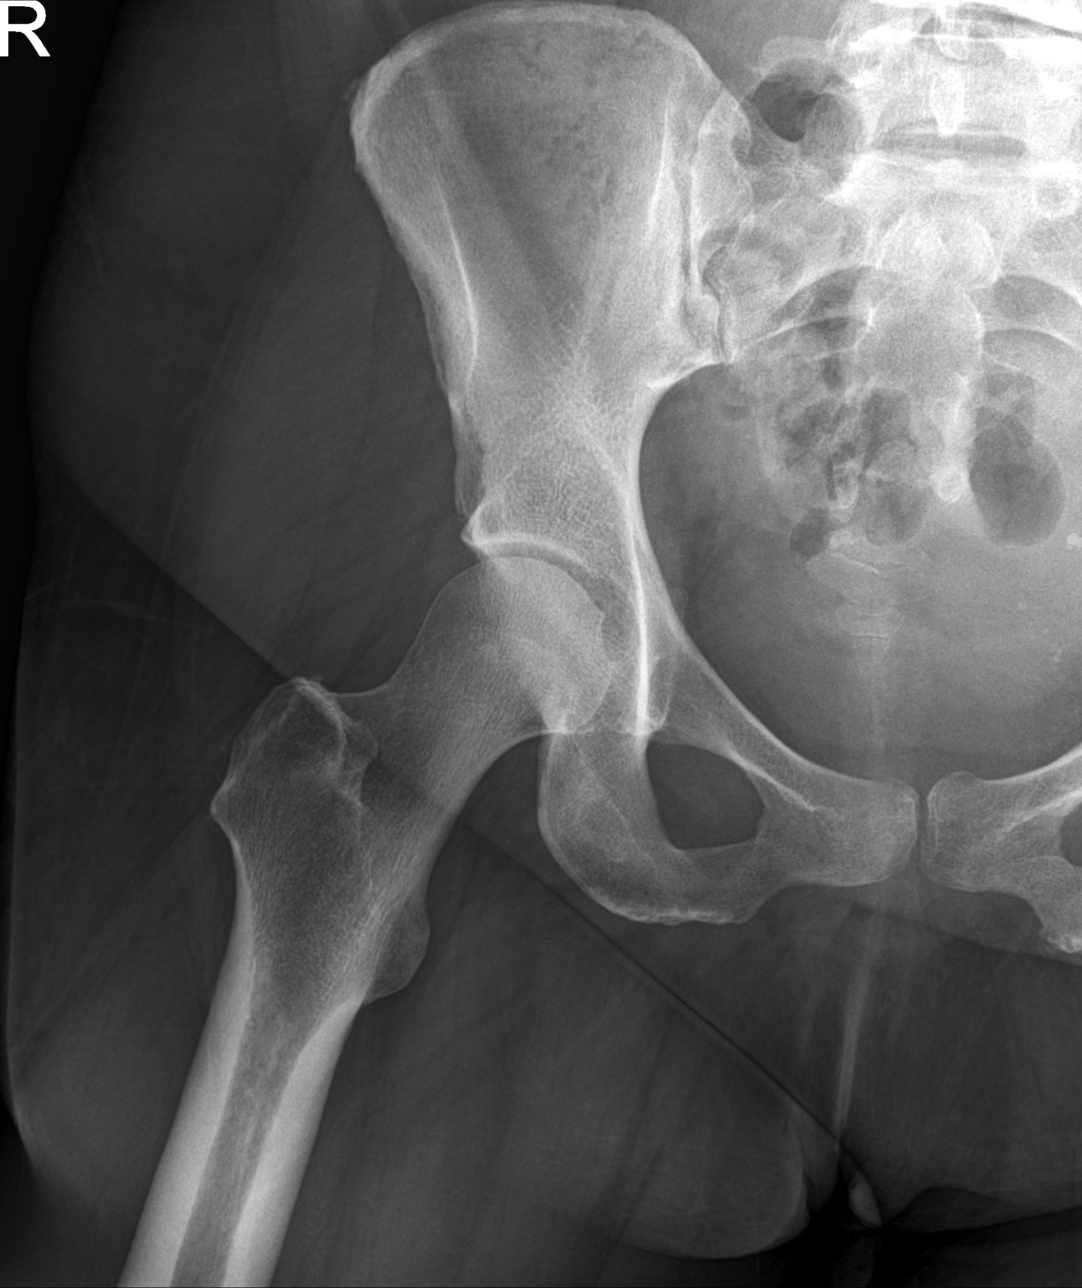

[3 of 3 positions shown; findings below may reference images not displayed]

FINDINGS: No pelvic fracture or diastasis. No right hip fracture or
dislocation. No right hip arthropathy. Mild lower lumbar
spondylosis. No suspicious focal osseous lesions. Tiny enthesophytes
at the greater trochanters bilaterally.
IMPRESSION: No acute osseous abnormality. No right hip arthropathy. Mild lower
lumbar spondylosis.

## 2022-01-07 ENCOUNTER — Ambulatory Visit (INDEPENDENT_AMBULATORY_CARE_PROVIDER_SITE_OTHER): Payer: BC Managed Care – PPO | Admitting: Nurse Practitioner

## 2022-01-07 ENCOUNTER — Encounter: Payer: Self-pay | Admitting: Nurse Practitioner

## 2022-01-07 VITALS — BP 116/70 | HR 94 | Ht 64.0 in | Wt 193.0 lb

## 2022-01-07 DIAGNOSIS — Z0000 Encounter for general adult medical examination without abnormal findings: Secondary | ICD-10-CM

## 2022-01-07 DIAGNOSIS — E119 Type 2 diabetes mellitus without complications: Secondary | ICD-10-CM

## 2022-01-07 DIAGNOSIS — M5441 Lumbago with sciatica, right side: Secondary | ICD-10-CM | POA: Diagnosis not present

## 2022-01-07 DIAGNOSIS — K047 Periapical abscess without sinus: Secondary | ICD-10-CM

## 2022-01-07 DIAGNOSIS — M25551 Pain in right hip: Secondary | ICD-10-CM | POA: Diagnosis not present

## 2022-01-07 DIAGNOSIS — Z1211 Encounter for screening for malignant neoplasm of colon: Secondary | ICD-10-CM

## 2022-01-07 DIAGNOSIS — Z0001 Encounter for general adult medical examination with abnormal findings: Secondary | ICD-10-CM | POA: Diagnosis not present

## 2022-01-07 DIAGNOSIS — G8929 Other chronic pain: Secondary | ICD-10-CM

## 2022-01-07 MED ORDER — CYCLOBENZAPRINE HCL 5 MG PO TABS: 5.0000 mg | ORAL_TABLET | Freq: Three times a day (TID) | ORAL | 1 refills | Status: DC | PRN

## 2022-01-07 MED ORDER — AMOXICILLIN-POT CLAVULANATE 875-125 MG PO TABS: 1.0000 | ORAL_TABLET | Freq: Two times a day (BID) | ORAL | 0 refills | Status: DC

## 2022-01-07 MED ORDER — METFORMIN HCL 500 MG PO TABS: 500.0000 mg | ORAL_TABLET | Freq: Two times a day (BID) | ORAL | 3 refills | Status: DC

## 2022-01-07 MED ORDER — PREDNISONE 20 MG PO TABS: 20.0000 mg | ORAL_TABLET | Freq: Every day | ORAL | 0 refills | Status: DC

## 2022-01-07 NOTE — Progress Notes (Signed)
Established Patient Office Visit  Subjective   Patient ID: Barbara Barber, female    DOB: Oct 15, 1970  Age: 51 y.o. MRN: [id number]  Chief Complaint  Patient presents with   Annual Exam    HPI  Diabetes Mellitus Type II, Follow-up: Patient here for follow-up of Type 2 diabetes mellitus.  Current symptoms/problems include none and have been stable. Symptoms have been present for 7 years.  Known diabetic complications: none Cardiovascular risk factors: diabetes mellitus and obesity (BMI >= 30 kg/m2) Current diabetic medications include  metformin 500 mg tablet by mouth twice daily. .   Eye exam current (within one year): yes Weight trend: stable Prior visit with dietician: no Current diet: well balanced Current exercise: none  Current monitoring regimen: home blood tests - daily Home blood sugar records: fasting range: Patient did not bring a blood glucose log to clinic today. Any episodes of hypoglycemia? no  Is She on ACE inhibitor or angiotensin II receptor blocker?  No       Hip Pain: Patient complains of right hip pain. Onset of the symptoms was several months ago. Inciting event: none. Current symptoms include is aggravated by walking. Associated symptoms: none. Aggravating symptoms: walking. Patient's overall course: intermittent. Patient has had no prior hip problems. Previous visits for this problem: yes, last seen a few months ago by me. Evaluation to date:  right hip x-ray .  Treatment to date: prednisone, and prescription analgesic which has been somewhat effective..     Dental Abscess  Patient following up today for dental abscess right upper molar.  Patient is unable to see dentist in the next few weeks and was unable to be a pain and discomfort.  Gums are swollen and red and infected.      Review of Systems  Constitutional: Negative.   HENT: Negative.         Dental abscess right upper molar  Respiratory: Negative.    Cardiovascular: Negative.    Genitourinary: Negative.   Musculoskeletal: Negative.   Skin: Negative.   All other systems reviewed and are negative.     Objective:     BP 116/70   Pulse 94   Ht 5\' 4"  (1.626 m)   Wt 193 lb (87.5 kg)   SpO2 97%   BMI 33.13 kg/m  BP Readings from Last 3 Encounters:  01/07/22 116/70  11/14/21 108/67  08/24/21 123/77   Wt Readings from Last 3 Encounters:  01/07/22 193 lb (87.5 kg)  11/14/21 199 lb (90.3 kg)  08/24/21 195 lb (88.5 kg)      Physical Exam Vitals and nursing note reviewed.  Constitutional:      Appearance: Normal appearance.  HENT:     Head: Normocephalic.     Right Ear: External ear normal.     Left Ear: External ear normal.     Nose: Nose normal.     Mouth/Throat:     Lips: Pink.     Mouth: Mucous membranes are moist.     Dentition: Dental caries and dental abscesses present.     Pharynx: Oropharynx is clear.      Comments: Right upper molar dental abscess Eyes:     Conjunctiva/sclera: Conjunctivae normal.  Cardiovascular:     Rate and Rhythm: Normal rate and regular rhythm.     Pulses: Normal pulses.     Heart sounds: Normal heart sounds.  Pulmonary:     Effort: Pulmonary effort is normal.     Breath sounds: Normal breath  sounds.  Abdominal:     General: Bowel sounds are normal.  Musculoskeletal:        General: Normal range of motion.  Skin:    General: Skin is warm.     Findings: No erythema or rash.  Neurological:     General: No focal deficit present.     Mental Status: She is alert and oriented to person, place, and time.  Psychiatric:        Mood and Affect: Mood normal.        Behavior: Behavior normal.      No results found for any visits on 01/07/22.  Last CBC Lab Results  Component Value Date   WBC 8.7 01/08/2021   HGB 14.0 01/08/2021   HCT 41.8 01/08/2021   MCV 90 01/08/2021   MCH 30.2 01/08/2021   RDW 14.5 01/08/2021   PLT 301 01/08/2021   Last metabolic panel Lab Results  Component Value Date    GLUCOSE 143 (H) 01/08/2021   NA 140 01/08/2021   K 5.1 01/08/2021   CL 101 01/08/2021   CO2 22 01/08/2021   BUN 9 01/08/2021   CREATININE 0.79 01/08/2021   EGFR 92 01/08/2021   CALCIUM 9.8 01/08/2021   PROT 7.3 01/08/2021   ALBUMIN 4.8 01/08/2021   LABGLOB 2.5 01/08/2021   AGRATIO 1.9 01/08/2021   BILITOT 0.2 01/08/2021   ALKPHOS 78 01/08/2021   AST 12 01/08/2021   ALT 15 01/08/2021   Last lipids Lab Results  Component Value Date   CHOL 247 (H) 01/08/2021   HDL 54 01/08/2021   LDLCALC 169 (H) 01/08/2021   TRIG 131 01/08/2021   CHOLHDL 4.6 (H) 01/08/2021   Last hemoglobin A1c Lab Results  Component Value Date   HGBA1C 6.2 (H) 11/14/2021   Last thyroid functions No results found for: "TSH", "T3TOTAL", "T4TOTAL", "THYROIDAB" Last vitamin D No results found for: "25OHVITD2", "25OHVITD3", "VD25OH" Last vitamin B12 and Folate No results found for: "VITAMINB12", "FOLATE"    The 10-year ASCVD risk score (Arnett DK, et al., 2019) is: 7.5%    Assessment & Plan:  Started patient on Augmentin 875-125 mg tablet by mouth twice daily for 7 days for dental abscess.  Tylenol/ibuprofen for pain as needed.   Prednisone taper to help with inflammation for right hip pain.   Patient is to follow-up with worsening unresolved symptoms. Problem List Items Addressed This Visit       Endocrine   DM2 (diabetes mellitus, type 2) (HCC)    Symptoms are well controlled .  Patient reports that 1000 mg metformin twice daily is too much causing her to have shaking fingers with tingling and numbness.  I provided education to patient that medication was increased due to changes in A1c. Patient reports she is doing fine and that she does not not need a high dose of metformin. Decrease metformin to 500 mg tablet by mouth twice daily.  Follow-up in 3 months.      Relevant Medications   metFORMIN (GLUCOPHAGE) 500 MG tablet   Other Relevant Orders   Microalbumin / creatinine urine ratio   CBC  with Differential/Platelet   CMP14+EGFR   Lipid panel   Glucose Hemocue Waived   Other Visit Diagnoses     Colon cancer screening    -  Primary   Relevant Orders   Cologuard   Chronic bilateral low back pain with right-sided sciatica       Relevant Medications   cyclobenzaprine (FLEXERIL) 5 MG tablet  predniSONE (DELTASONE) 20 MG tablet   Health maintenance examination       Relevant Orders   Hepatitis C antibody   HIV Antibody (routine testing w rflx)   Pain of right hip       Relevant Medications   predniSONE (DELTASONE) 20 MG tablet   Dental abscess       Relevant Medications   amoxicillin-clavulanate (AUGMENTIN) 875-125 MG tablet       Return in 3 months (on 04/09/2022), or if symptoms worsen or fail to improve, for chronic disease management.    Daryll Drown, NP

## 2022-01-07 NOTE — Assessment & Plan Note (Signed)
Symptoms are well controlled .  Patient reports that 1000 mg metformin twice daily is too much causing her to have shaking fingers with tingling and numbness.  I provided education to patient that medication was increased due to changes in A1c. Patient reports she is doing fine and that she does not not need a high dose of metformin. Decrease metformin to 500 mg tablet by mouth twice daily.  Follow-up in 3 months.

## 2022-01-08 LAB — CBC WITH DIFFERENTIAL/PLATELET
Basophils Absolute: 0.1 10*3/uL (ref 0.0–0.2)
Basos: 1 %
EOS (ABSOLUTE): 0.4 10*3/uL (ref 0.0–0.4)
Eos: 5 %
Hematocrit: 40.5 % (ref 34.0–46.6)
Hemoglobin: 14 g/dL (ref 11.1–15.9)
Immature Grans (Abs): 0 10*3/uL (ref 0.0–0.1)
Immature Granulocytes: 0 %
Lymphocytes Absolute: 2.5 10*3/uL (ref 0.7–3.1)
Lymphs: 28 %
MCH: 30.8 pg (ref 26.6–33.0)
MCHC: 34.6 g/dL (ref 31.5–35.7)
MCV: 89 fL (ref 79–97)
Monocytes Absolute: 0.4 10*3/uL (ref 0.1–0.9)
Monocytes: 5 %
Neutrophils Absolute: 5.5 10*3/uL (ref 1.4–7.0)
Neutrophils: 61 %
Platelets: 273 10*3/uL (ref 150–450)
RBC: 4.55 x10E6/uL (ref 3.77–5.28)
RDW: 13.7 % (ref 11.7–15.4)
WBC: 9 10*3/uL (ref 3.4–10.8)

## 2022-01-08 LAB — CMP14+EGFR
ALT: 15 IU/L (ref 0–32)
AST: 11 IU/L (ref 0–40)
Albumin/Globulin Ratio: 1.9 (ref 1.2–2.2)
Albumin: 4.6 g/dL (ref 3.8–4.8)
Alkaline Phosphatase: 86 IU/L (ref 44–121)
BUN/Creatinine Ratio: 12 (ref 9–23)
BUN: 10 mg/dL (ref 6–24)
Bilirubin Total: <0.2 mg/dL (ref 0.0–1.2)
CO2: 23 mmol/L (ref 20–29)
Calcium: 9.7 mg/dL (ref 8.7–10.2)
Chloride: 100 mmol/L (ref 96–106)
Creatinine, Ser: 0.83 mg/dL (ref 0.57–1.00)
Globulin, Total: 2.4 g/dL (ref 1.5–4.5)
Glucose: 159 mg/dL — ABNORMAL HIGH (ref 70–99)
Potassium: 4.6 mmol/L (ref 3.5–5.2)
Sodium: 139 mmol/L (ref 134–144)
Total Protein: 7 g/dL (ref 6.0–8.5)
eGFR: 86 mL/min/{1.73_m2} (ref >59)

## 2022-01-08 LAB — MICROALBUMIN / CREATININE URINE RATIO
Creatinine, Urine: 21 mg/dL
Microalb/Creat Ratio: <14 mg/g creat (ref 0–29)
Microalbumin, Urine: <3 ug/mL

## 2022-01-08 LAB — LIPID PANEL
Chol/HDL Ratio: 5.2 ratio — ABNORMAL HIGH (ref 0.0–4.4)
Cholesterol, Total: 264 mg/dL — ABNORMAL HIGH (ref 100–199)
HDL: 51 mg/dL (ref >39)
LDL Chol Calc (NIH): 178 mg/dL — ABNORMAL HIGH (ref 0–99)
Triglycerides: 186 mg/dL — ABNORMAL HIGH (ref 0–149)
VLDL Cholesterol Cal: 35 mg/dL (ref 5–40)

## 2022-01-08 LAB — HIV ANTIBODY (ROUTINE TESTING W REFLEX): HIV Screen 4th Generation wRfx: NONREACTIVE

## 2022-01-08 LAB — HEPATITIS C ANTIBODY: Hep C Virus Ab: NONREACTIVE

## 2022-01-27 ENCOUNTER — Other Ambulatory Visit: Payer: Self-pay | Admitting: Family Medicine

## 2022-02-16 LAB — COLOGUARD: COLOGUARD: NEGATIVE

## 2022-02-28 ENCOUNTER — Other Ambulatory Visit: Payer: Self-pay | Admitting: Nurse Practitioner

## 2022-03-10 ENCOUNTER — Other Ambulatory Visit: Payer: Self-pay | Admitting: Nurse Practitioner

## 2022-03-11 ENCOUNTER — Encounter: Payer: Self-pay | Admitting: Nurse Practitioner

## 2022-03-11 NOTE — Telephone Encounter (Signed)
Je NTBS in Sept for 3 mos ckup. RF sent

## 2022-03-11 NOTE — Telephone Encounter (Signed)
Left message on machine to call back and schedule appt. Letter sent.  ?

## 2022-05-03 ENCOUNTER — Encounter: Payer: Self-pay | Admitting: Nurse Practitioner

## 2022-05-03 NOTE — Telephone Encounter (Signed)
Please schedule patient a office visit or virtual visit with after hours today,

## 2022-05-21 ENCOUNTER — Telehealth: Payer: Self-pay | Admitting: Nurse Practitioner

## 2022-05-21 DIAGNOSIS — J069 Acute upper respiratory infection, unspecified: Secondary | ICD-10-CM

## 2022-05-21 MED ORDER — ALBUTEROL SULFATE HFA 108 (90 BASE) MCG/ACT IN AERS: 2.0000 | INHALATION_SPRAY | Freq: Four times a day (QID) | RESPIRATORY_TRACT | 0 refills | Status: DC | PRN

## 2022-05-21 NOTE — Telephone Encounter (Signed)
LMOVM refill sent to pharmacy 

## 2022-05-21 NOTE — Telephone Encounter (Signed)
  Prescription Request  05/21/2022  Is this a "Controlled Substance" medicine? albuterol (VENTOLIN HFA) 108 (90 Base) MCG/ACT inhaler   Have you seen your PCP in the last 2 weeks? 05/29/2022  If YES, route message to pool  -  If NO, patient needs to be scheduled for appointment.  What is the name of the medication or equipment? albuterol (VENTOLIN HFA) 108 (90 Base) MCG/ACT inhaler   Have you contacted your pharmacy to request a refill? no   Which pharmacy would you like this sent to? [facility]   Patient notified that their request is being sent to the clinical staff for review and that they should receive a response within 2 business days.

## 2022-05-29 ENCOUNTER — Encounter: Payer: Self-pay | Admitting: Nurse Practitioner

## 2022-05-29 ENCOUNTER — Ambulatory Visit: Payer: BC Managed Care – PPO | Admitting: Nurse Practitioner

## 2022-05-29 VITALS — BP 114/79 | HR 91 | Temp 98.5°F | Ht 64.0 in | Wt 193.0 lb

## 2022-05-29 DIAGNOSIS — G4700 Insomnia, unspecified: Secondary | ICD-10-CM

## 2022-05-29 DIAGNOSIS — G959 Disease of spinal cord, unspecified: Secondary | ICD-10-CM | POA: Diagnosis not present

## 2022-05-29 DIAGNOSIS — J449 Chronic obstructive pulmonary disease, unspecified: Secondary | ICD-10-CM

## 2022-05-29 DIAGNOSIS — E119 Type 2 diabetes mellitus without complications: Secondary | ICD-10-CM | POA: Diagnosis not present

## 2022-05-29 DIAGNOSIS — J069 Acute upper respiratory infection, unspecified: Secondary | ICD-10-CM

## 2022-05-29 NOTE — Patient Instructions (Signed)
Diabetes Mellitus Basics  Diabetes mellitus, or diabetes, is a long-term (chronic) disease. It occurs when the body does not properly use sugar (glucose) that is released from food after you eat. Diabetes mellitus may be caused by one or both of these problems: Your pancreas does not make enough of a hormone called insulin. Your body does not react in a normal way to the insulin that it makes. Insulin lets glucose enter cells in your body. This gives you energy. If you have diabetes, glucose cannot get into cells. This causes high blood glucose (hyperglycemia). How to treat and manage diabetes You may need to take insulin or other diabetes medicines daily to keep your glucose in balance. If you are prescribed insulin, you will learn how to give yourself insulin by injection. You may need to adjust the amount of insulin you take based on the foods that you eat. You will need to check your blood glucose levels using a glucose monitor as told by your health care provider. The readings can help determine if you have low or high blood glucose. Generally, you should have these blood glucose levels: Before meals (preprandial): 80-130 mg/dL (4.4-7.2 mmol/L). After meals (postprandial): below 180 mg/dL (10 mmol/L). Hemoglobin A1c (HbA1c) level: less than 7%. Your health care provider will set treatment goals for you. Keep all follow-up visits. This is important. Follow these instructions at home: Diabetes medicines Take your diabetes medicines every day as told by your health care provider. List your diabetes medicines here: Name of medicine: ______________________________ Amount (dose): _______________ Time (a.m./p.m.): _______________ Notes: ___________________________________ Name of medicine: ______________________________ Amount (dose): _______________ Time (a.m./p.m.): _______________ Notes: ___________________________________ Name of medicine: ______________________________ Amount (dose):  _______________ Time (a.m./p.m.): _______________ Notes: ___________________________________ Insulin If you use insulin, list the types of insulin you use here: Insulin type: ______________________________ Amount (dose): _______________ Time (a.m./p.m.): _______________Notes: ___________________________________ Insulin type: ______________________________ Amount (dose): _______________ Time (a.m./p.m.): _______________ Notes: ___________________________________ Insulin type: ______________________________ Amount (dose): _______________ Time (a.m./p.m.): _______________ Notes: ___________________________________ Insulin type: ______________________________ Amount (dose): _______________ Time (a.m./p.m.): _______________ Notes: ___________________________________ Insulin type: ______________________________ Amount (dose): _______________ Time (a.m./p.m.): _______________ Notes: ___________________________________ Managing blood glucose  Check your blood glucose levels using a glucose monitor as told by your health care provider. Write down the times that you check your glucose levels here: Time: _______________ Notes: ___________________________________ Time: _______________ Notes: ___________________________________ Time: _______________ Notes: ___________________________________ Time: _______________ Notes: ___________________________________ Time: _______________ Notes: ___________________________________ Time: _______________ Notes: ___________________________________  Low blood glucose Low blood glucose (hypoglycemia) is when glucose is at or below 70 mg/dL (3.9 mmol/L). Symptoms may include: Feeling: Hungry. Sweaty and clammy. Irritable or easily upset. Dizzy. Sleepy. Having: A fast heartbeat. A headache. A change in your vision. Numbness around the mouth, lips, or tongue. Having trouble with: Moving (coordination). Sleeping. Treating low blood glucose To treat low blood  glucose, eat or drink something containing sugar right away. If you can think clearly and swallow safely, follow the 15:15 rule: Take 15 grams of a fast-acting carb (carbohydrate), as told by your health care provider. Some fast-acting carbs are: Glucose tablets: take 3-4 tablets. Hard candy: eat 3-5 pieces. Fruit juice: drink 4 oz (120 mL). Regular (not diet) soda: drink 4-6 oz (120-180 mL). Honey or sugar: eat 1 Tbsp (15 mL). Check your blood glucose levels 15 minutes after you take the carb. If your glucose is still at or below 70 mg/dL (3.9 mmol/L), take 15 grams of a carb again. If your glucose does not go above 70 mg/dL (3.9 mmol/L) after   3 tries, get help right away. After your glucose goes back to normal, eat a meal or a snack within 1 hour. Treating very low blood glucose If your glucose is at or below 54 mg/dL (3 mmol/L), you have very low blood glucose (severe hypoglycemia). This is an emergency. Do not wait to see if the symptoms will go away. Get medical help right away. Call your local emergency services (911 in the [place] Do not drive yourself to the hospital. Questions to ask your health care provider Should I talk with a diabetes educator? What equipment will I need to care for myself at home? What diabetes medicines do I need? When should I take them? How often do I need to check my blood glucose levels? What number can I call if I have questions? When is my follow-up visit? Where can I find a support group for people with diabetes? Where to find more information American Diabetes Association: www.diabetes.org Association of Diabetes Care and Education Specialists: www.diabeteseducator.org Contact a health care provider if: Your blood glucose is at or above 240 mg/dL (13.3 mmol/L) for 2 days in a row. You have been sick or have had a fever for 2 days or more, and you are not getting better. You have any of these problems for more than 6 hours: You cannot eat or  drink. You feel nauseous. You vomit. You have diarrhea. Get help right away if: Your blood glucose is lower than 54 mg/dL (3 mmol/L). You get confused. You have trouble thinking clearly. You have trouble breathing. These symptoms may represent a serious problem that is an emergency. Do not wait to see if the symptoms will go away. Get medical help right away. Call your local emergency services (911 in the [place] Do not drive yourself to the hospital. Summary Diabetes mellitus is a chronic disease that occurs when the body does not properly use sugar (glucose) that is released from food after you eat. Take insulin and diabetes medicines as told. Check your blood glucose every day, as often as told. Keep all follow-up visits. This is important. This information is not intended to replace advice given to you by your health care provider. Make sure you discuss any questions you have with your health care provider. Document Revised: 11/02/2019 Document Reviewed: 11/02/2019 Elsevier Patient Education  2023 Elsevier Inc.  

## 2022-05-29 NOTE — Progress Notes (Unsigned)
   Acute Office Visit  Subjective:     Patient ID: Barbara Barber, female    DOB: 02-13-1971, 51 y.o.   MRN: [id number]  Chief Complaint  Patient presents with   Medication Refill   Diabetes    Medication Refill  Diabetes   Patient is in today for ***  ROS      Objective:    BP 114/79   Pulse 91   Temp 98.5 F (36.9 C)   Ht _0  (1.626 m)   Wt 193 lb (87.5 kg)   SpO2 96%   BMI 33.13 kg/m  {Vitals History (Optional):23777}  Physical Exam  No results found for any visits on 05/29/22.      Assessment & Plan:   Problem List Items Addressed This Visit       Endocrine   DM2 (diabetes mellitus, type 2) ([place]) - Primary   Relevant Orders   Bayer DCA Hb A1c Waived   CBC with Differential   CMP14+EGFR   Lipid Panel    No orders of the defined types were placed in this encounter.   Return in about 6 months (around 11/27/2022) for chronic disease managment.  Ivy Lynn, NP

## 2022-05-30 DIAGNOSIS — J449 Chronic obstructive pulmonary disease, unspecified: Secondary | ICD-10-CM | POA: Insufficient documentation

## 2022-05-30 DIAGNOSIS — G4700 Insomnia, unspecified: Secondary | ICD-10-CM | POA: Insufficient documentation

## 2022-05-30 DIAGNOSIS — G959 Disease of spinal cord, unspecified: Secondary | ICD-10-CM | POA: Insufficient documentation

## 2022-05-30 LAB — CMP14+EGFR
ALT: 17 IU/L (ref 0–32)
AST: 14 IU/L (ref 0–40)
Albumin/Globulin Ratio: 2 (ref 1.2–2.2)
Albumin: 4.8 g/dL (ref 3.9–4.9)
Alkaline Phosphatase: 85 IU/L (ref 44–121)
BUN/Creatinine Ratio: 16 (ref 9–23)
BUN: 14 mg/dL (ref 6–24)
Bilirubin Total: <0.2 mg/dL (ref 0.0–1.2)
CO2: 22 mmol/L (ref 20–29)
Calcium: 9.8 mg/dL (ref 8.7–10.2)
Chloride: 100 mmol/L (ref 96–106)
Creatinine, Ser: 0.85 mg/dL (ref 0.57–1.00)
Globulin, Total: 2.4 g/dL (ref 1.5–4.5)
Glucose: 132 mg/dL — ABNORMAL HIGH (ref 70–99)
Potassium: 4.1 mmol/L (ref 3.5–5.2)
Sodium: 137 mmol/L (ref 134–144)
Total Protein: 7.2 g/dL (ref 6.0–8.5)
eGFR: 83 mL/min/{1.73_m2} (ref >59)

## 2022-05-30 LAB — CBC WITH DIFFERENTIAL/PLATELET
Basophils Absolute: 0.1 10*3/uL (ref 0.0–0.2)
Basos: 1 %
EOS (ABSOLUTE): 0.3 10*3/uL (ref 0.0–0.4)
Eos: 3 %
Hematocrit: 37.8 % (ref 34.0–46.6)
Hemoglobin: 12.8 g/dL (ref 11.1–15.9)
Immature Grans (Abs): 0 10*3/uL (ref 0.0–0.1)
Immature Granulocytes: 0 %
Lymphocytes Absolute: 2.6 10*3/uL (ref 0.7–3.1)
Lymphs: 30 %
MCH: 29.8 pg (ref 26.6–33.0)
MCHC: 33.9 g/dL (ref 31.5–35.7)
MCV: 88 fL (ref 79–97)
Monocytes Absolute: 0.5 10*3/uL (ref 0.1–0.9)
Monocytes: 5 %
Neutrophils Absolute: 5.2 10*3/uL (ref 1.4–7.0)
Neutrophils: 61 %
Platelets: 304 10*3/uL (ref 150–450)
RBC: 4.3 x10E6/uL (ref 3.77–5.28)
RDW: 13.8 % (ref 11.7–15.4)
WBC: 8.7 10*3/uL (ref 3.4–10.8)

## 2022-05-30 LAB — LIPID PANEL
Chol/HDL Ratio: 4.6 ratio — ABNORMAL HIGH (ref 0.0–4.4)
Cholesterol, Total: 264 mg/dL — ABNORMAL HIGH (ref 100–199)
HDL: 57 mg/dL (ref >39)
LDL Chol Calc (NIH): 176 mg/dL — ABNORMAL HIGH (ref 0–99)
Triglycerides: 169 mg/dL — ABNORMAL HIGH (ref 0–149)
VLDL Cholesterol Cal: 31 mg/dL (ref 5–40)

## 2022-05-30 LAB — BAYER DCA HB A1C WAIVED: HB A1C (BAYER DCA - WAIVED): 7.1 % — ABNORMAL HIGH (ref 4.8–5.6)

## 2022-05-30 MED ORDER — BREO ELLIPTA 100-25 MCG/ACT IN AEPB: INHALATION_SPRAY | RESPIRATORY_TRACT | 2 refills | Status: DC

## 2022-05-30 MED ORDER — ALBUTEROL SULFATE HFA 108 (90 BASE) MCG/ACT IN AERS: 2.0000 | INHALATION_SPRAY | Freq: Four times a day (QID) | RESPIRATORY_TRACT | 0 refills | Status: DC | PRN

## 2022-05-30 NOTE — Assessment & Plan Note (Signed)
No changes to current regimen.  Diabetes well controlled on current medication.  Completed labs CBC, CMP, TSH and lipid panel results pending.  Continue diabetic diet, exercise as tolerated follow-up in 6 months.

## 2022-05-30 NOTE — Assessment & Plan Note (Signed)
Patient is reporting worsening insomnia.  Provided education to patient to decrease caffeine intake, reduce stress, melatonin over-the-counter and follow-up with unresolved worsening symptoms.

## 2022-05-31 ENCOUNTER — Encounter: Payer: Self-pay | Admitting: Nurse Practitioner

## 2022-05-31 ENCOUNTER — Other Ambulatory Visit: Payer: Self-pay | Admitting: Nurse Practitioner

## 2022-05-31 DIAGNOSIS — R7309 Other abnormal glucose: Secondary | ICD-10-CM

## 2022-05-31 MED ORDER — METFORMIN HCL 1000 MG PO TABS: 1000.0000 mg | ORAL_TABLET | Freq: Two times a day (BID) | ORAL | 3 refills | Status: DC

## 2022-06-02 ENCOUNTER — Other Ambulatory Visit: Payer: Self-pay | Admitting: Nurse Practitioner

## 2022-06-02 DIAGNOSIS — G8929 Other chronic pain: Secondary | ICD-10-CM

## 2022-06-11 ENCOUNTER — Other Ambulatory Visit: Payer: Self-pay | Admitting: Nurse Practitioner

## 2022-06-11 ENCOUNTER — Encounter: Payer: Self-pay | Admitting: Nurse Practitioner

## 2022-06-11 NOTE — Telephone Encounter (Signed)
Please schedule patient Tele or Office visit so we can assess symptoms. Thank you

## 2022-06-27 ENCOUNTER — Telehealth: Payer: Self-pay | Admitting: Internal Medicine

## 2022-06-27 ENCOUNTER — Encounter: Payer: Self-pay | Admitting: *Deleted

## 2022-06-27 ENCOUNTER — Encounter: Payer: Self-pay | Admitting: Internal Medicine

## 2022-06-27 ENCOUNTER — Ambulatory Visit: Payer: BC Managed Care – PPO | Attending: Internal Medicine | Admitting: Internal Medicine

## 2022-06-27 VITALS — BP 120/80 | HR 94 | Ht 64.0 in | Wt 194.2 lb

## 2022-06-27 DIAGNOSIS — I471 Supraventricular tachycardia, unspecified: Secondary | ICD-10-CM

## 2022-06-27 DIAGNOSIS — R079 Chest pain, unspecified: Secondary | ICD-10-CM | POA: Diagnosis not present

## 2022-06-27 MED ORDER — METOPROLOL TARTRATE 25 MG PO TABS: 25.0000 mg | ORAL_TABLET | Freq: Two times a day (BID) | ORAL | 2 refills | Status: DC

## 2022-06-27 NOTE — Progress Notes (Signed)
Cardiology Office Note  Date: 06/27/2022   ID: CHIQUETTA Barber, DOB 29-Mar-1971, MRN [id number]  PCP:  Daryll Drown, NP  Cardiologist:  Marjo Bicker, MD Electrophysiologist:  None   Reason for Office Visit: Follow-up of SVT   History of Present Illness: Barbara Barber is a 51 y.o. female known to have SVT, DM 2 presented to cardiology clinic for follow-up visit of her SVT. She is currently on Cardizem 30 mg twice a day for SVT management. No interval ER visits or hospitalizations for the same.  In the last 4 weeks, she is experiencing substernal chest tightness lasting for few minutes to few days, occurs during stress and resolves spontaneously. She is unable to tell me the frequency per week but happens whenever she is stressed out. She does have a lot of stress going on in her personal life. She has a mother who has dementia and patient is the sole caretaker. She is tearful in the clinic visit today.  Denied any other symptoms like dizziness/lightheadedness, syncope, LE swelling.  Past Medical History:  Diagnosis Date   Asthma    Bulging lumbar disc    COPD (chronic obstructive pulmonary disease) (HCC)    Diabetes mellitus without complication (HCC)    Fibromyalgia    Fibromyalgia    Hyperlipidemia    Lumbar herniated disc    X4   Migraines     Past Surgical History:  Procedure Laterality Date   APPENDECTOMY      Current Outpatient Medications  Medication Sig Dispense Refill   albuterol (VENTOLIN HFA) 108 (90 Base) MCG/ACT inhaler Inhale 2 puffs into the lungs every 6 (six) hours as needed for wheezing or shortness of breath. 18 g 0   BREO ELLIPTA 100-25 MCG/ACT AEPB Inhale 1 puff by mouth once daily 60 each 2   cyclobenzaprine (FLEXERIL) 5 MG tablet Take 1 tablet by mouth three times daily as needed for muscle spasm 30 tablet 1   metFORMIN (GLUCOPHAGE) 1000 MG tablet Take 1 tablet (1,000 mg total) by mouth 2 (two) times daily with a meal. 180 tablet 3    metoprolol tartrate (LOPRESSOR) 25 MG tablet Take 1 tablet (25 mg total) by mouth 2 (two) times daily. 180 tablet 2   TRULICITY 3 MG/0.5ML SOPN INJECT 3 MG INTO THE SKIN ONCE A WEEK 6 mL 0   gabapentin (NEURONTIN) 100 MG capsule Take 1 capsule (100 mg total) by mouth 2 (two) times daily as needed. (Patient not taking: Reported on 06/27/2022) 30 capsule 0   nabumetone (RELAFEN) 500 MG tablet Take 2 tablets (1,000 mg total) by mouth 2 (two) times daily as needed. (Patient not taking: Reported on 06/27/2022) 120 tablet 3   No current facility-administered medications for this visit.   Allergies:  Ace inhibitors and Other   Social History: The patient  reports that she has been smoking cigarettes. She has a 12.00 pack-year smoking history. She has never used smokeless tobacco. She reports that she does not drink alcohol and does not use drugs.   Family History: The patient's family history includes Alcohol abuse in her paternal uncle; Diabetes in her paternal grandmother; Heart attack in her father; Heart disease in her paternal grandmother; Lung disease in her father; Pelvic inflammatory disease in her mother; Stroke in her father and maternal grandmother; Ulcers in her father.   ROS:  Please see the history of present illness. Otherwise, complete review of systems is positive for none.  All other systems are  reviewed and negative.   Physical Exam: VS:  BP 120/80   Pulse 94   Ht 5\' 4"  (1.626 m)   Wt 194 lb 3.2 oz (88.1 kg)   SpO2 97%   BMI 33.33 kg/m , BMI Body mass index is 33.33 kg/m.  Wt Readings from Last 3 Encounters:  06/27/22 194 lb 3.2 oz (88.1 kg)  05/29/22 193 lb (87.5 kg)  01/07/22 193 lb (87.5 kg)    General: Patient appears comfortable at rest. HEENT: Conjunctiva and lids normal, oropharynx clear with moist mucosa. Neck: Supple, no elevated JVP or carotid bruits, no thyromegaly. Lungs: Clear to auscultation, nonlabored breathing at rest. Cardiac: Regular rate and rhythm,  no S3 or significant systolic murmur, no pericardial rub. Abdomen: Soft, nontender, no hepatomegaly, bowel sounds present, no guarding or rebound. Extremities: No pitting edema, distal pulses 2+. Skin: Warm and dry. Musculoskeletal: No kyphosis. Neuropsychiatric: Alert and oriented x3, affect grossly appropriate.  ECG: Normal sinus rhythm  Recent Labwork: 05/29/2022: ALT 17; AST 14; BUN 14; Creatinine, Ser 0.85; Hemoglobin 12.8; Platelets 304; Potassium 4.1; Sodium 137     Component Value Date/Time   CHOL 264 (H) 05/29/2022 1653   TRIG 169 (H) 05/29/2022 1653   HDL 57 05/29/2022 1653   CHOLHDL 4.6 (H) 05/29/2022 1653   LDLCALC 176 (H) 05/29/2022 1653    Other Studies Reviewed Today:   Assessment and Plan: Patient is a 51 year old F known to have SVT, DM 2 presented to cardiology clinic for follow-up visit.  # Chest tightness likely secondary to stress, r/o CAD -Obtain Lexiscan -Switch Cardizem 30 mg twice daily to metoprolol 25 mg twice daily.  Can take an additional dose of metoprolol if she continues to experience chest tightness. -Stress management techniques discussed  # SVT, controlled -No interval ER visits or hospitalizations for SVT -Switch Cardizem to metoprolol 25 mg twice daily  I have spent a total of 33 minutes with patient reviewing chart, EKGs, labs and examining patient as well as establishing an assessment and plan that was discussed with the patient.  > 50% of time was spent in direct patient care.      Medication Adjustments/Labs and Tests Ordered: Current medicines are reviewed at length with the patient today.  Concerns regarding medicines are outlined above.   Tests Ordered: Orders Placed This Encounter  Procedures   NM Myocar Multi W/Spect W/Wall Motion / EF   EKG 12-Lead    Medication Changes: Meds ordered this encounter  Medications   metoprolol tartrate (LOPRESSOR) 25 MG tablet    Sig: Take 1 tablet (25 mg total) by mouth 2 (two) times  daily.    Dispense:  180 tablet    Refill:  2    06/27/2022 NEW-stop diltiazem    Disposition:  Follow up  one year  Signed Erabella Kuipers 06/29/2022, MD, 06/27/2022 5:07 PM    [facility] at [facility] [address] [place], [place], [place]

## 2022-06-27 NOTE — Patient Instructions (Addendum)
Medication Instructions:  Your physician has recommended you make the following change in your medication:  Stop diltiazem Start metoprolol tartrate 25 mg twice daily Continue other medications the same  Labwork: none  Testing/Procedures: Your physician has requested that you have a lexiscan myoview. For further information please visit [email or web address] Please follow instruction sheet, as given.  Follow-Up: Your physician recommends that you schedule a follow-up appointment in: 1 year. You will receive a reminder call in the mail in about 10 months reminding you to call and schedule your appointment. If you don't receive this call, please contact our office.  Any Other Special Instructions Will Be Listed Below (If Applicable).  If you need a refill on your cardiac medications before your next appointment, please call your pharmacy.

## 2022-06-27 NOTE — Telephone Encounter (Signed)
Checking percert on the following patient for testing scheduled at [facility].   LEXISCAN 07/04/2022 

## 2022-07-01 ENCOUNTER — Encounter: Payer: Self-pay | Admitting: Internal Medicine

## 2022-07-04 ENCOUNTER — Encounter (HOSPITAL_COMMUNITY): Payer: BC Managed Care – PPO

## 2022-07-04 ENCOUNTER — Ambulatory Visit (HOSPITAL_COMMUNITY): Payer: BC Managed Care – PPO

## 2022-07-25 ENCOUNTER — Other Ambulatory Visit: Payer: Self-pay | Admitting: Nurse Practitioner

## 2022-07-25 DIAGNOSIS — G8929 Other chronic pain: Secondary | ICD-10-CM

## 2022-07-26 NOTE — Telephone Encounter (Signed)
Last office visit 05/29/22 Last refill 06/03/22, #30, 1 refill

## 2022-07-30 ENCOUNTER — Encounter: Payer: Self-pay | Admitting: *Deleted

## 2022-08-13 ENCOUNTER — Other Ambulatory Visit: Payer: Self-pay

## 2022-08-13 MED ORDER — TRULICITY 3 MG/0.5ML ~~LOC~~ SOAJ: SUBCUTANEOUS | 0 refills | Status: DC

## 2022-08-22 ENCOUNTER — Encounter: Payer: Self-pay | Admitting: Family Medicine

## 2022-08-26 ENCOUNTER — Other Ambulatory Visit: Payer: Self-pay | Admitting: *Deleted

## 2022-08-26 DIAGNOSIS — G8929 Other chronic pain: Secondary | ICD-10-CM

## 2022-08-28 MED ORDER — CYCLOBENZAPRINE HCL 5 MG PO TABS: 5.0000 mg | ORAL_TABLET | Freq: Three times a day (TID) | ORAL | 0 refills | Status: DC | PRN

## 2022-09-03 ENCOUNTER — Other Ambulatory Visit: Payer: Self-pay | Admitting: *Deleted

## 2022-09-03 DIAGNOSIS — J069 Acute upper respiratory infection, unspecified: Secondary | ICD-10-CM

## 2022-09-03 DIAGNOSIS — J449 Chronic obstructive pulmonary disease, unspecified: Secondary | ICD-10-CM

## 2022-09-03 MED ORDER — ALBUTEROL SULFATE HFA 108 (90 BASE) MCG/ACT IN AERS: 2.0000 | INHALATION_SPRAY | Freq: Four times a day (QID) | RESPIRATORY_TRACT | 1 refills | Status: DC | PRN

## 2022-09-09 ENCOUNTER — Other Ambulatory Visit: Payer: Self-pay | Admitting: *Deleted

## 2022-09-09 DIAGNOSIS — J449 Chronic obstructive pulmonary disease, unspecified: Secondary | ICD-10-CM

## 2022-09-09 MED ORDER — BREO ELLIPTA 100-25 MCG/ACT IN AEPB: INHALATION_SPRAY | RESPIRATORY_TRACT | 2 refills | Status: DC

## 2022-10-06 ENCOUNTER — Other Ambulatory Visit: Payer: Self-pay | Admitting: Family Medicine

## 2022-10-06 DIAGNOSIS — G8929 Other chronic pain: Secondary | ICD-10-CM

## 2022-11-10 ENCOUNTER — Other Ambulatory Visit: Payer: Self-pay | Admitting: Family Medicine

## 2022-11-10 DIAGNOSIS — G8929 Other chronic pain: Secondary | ICD-10-CM

## 2022-11-27 ENCOUNTER — Ambulatory Visit: Payer: BC Managed Care – PPO | Admitting: Nurse Practitioner

## 2022-11-27 ENCOUNTER — Ambulatory Visit: Payer: BC Managed Care – PPO | Admitting: Family Medicine

## 2022-11-27 VITALS — BP 100/63 | HR 87 | Temp 97.6°F | Ht 64.0 in | Wt 194.0 lb

## 2022-11-27 DIAGNOSIS — I471 Supraventricular tachycardia, unspecified: Secondary | ICD-10-CM

## 2022-11-27 DIAGNOSIS — E1169 Type 2 diabetes mellitus with other specified complication: Secondary | ICD-10-CM | POA: Insufficient documentation

## 2022-11-27 DIAGNOSIS — J449 Chronic obstructive pulmonary disease, unspecified: Secondary | ICD-10-CM | POA: Diagnosis not present

## 2022-11-27 DIAGNOSIS — E6609 Other obesity due to excess calories: Secondary | ICD-10-CM | POA: Diagnosis not present

## 2022-11-27 DIAGNOSIS — Z7984 Long term (current) use of oral hypoglycemic drugs: Secondary | ICD-10-CM

## 2022-11-27 DIAGNOSIS — F339 Major depressive disorder, recurrent, unspecified: Secondary | ICD-10-CM

## 2022-11-27 DIAGNOSIS — E119 Type 2 diabetes mellitus without complications: Secondary | ICD-10-CM

## 2022-11-27 DIAGNOSIS — F419 Anxiety disorder, unspecified: Secondary | ICD-10-CM

## 2022-11-27 DIAGNOSIS — R7309 Other abnormal glucose: Secondary | ICD-10-CM

## 2022-11-27 DIAGNOSIS — Z6833 Body mass index (BMI) 33.0-33.9, adult: Secondary | ICD-10-CM

## 2022-11-27 DIAGNOSIS — Z789 Other specified health status: Secondary | ICD-10-CM | POA: Insufficient documentation

## 2022-11-27 DIAGNOSIS — E785 Hyperlipidemia, unspecified: Secondary | ICD-10-CM

## 2022-11-27 LAB — BAYER DCA HB A1C WAIVED: HB A1C (BAYER DCA - WAIVED): 6.8 % — ABNORMAL HIGH (ref 4.8–5.6)

## 2022-11-27 MED ORDER — BREO ELLIPTA 100-25 MCG/ACT IN AEPB: INHALATION_SPRAY | RESPIRATORY_TRACT | 2 refills | Status: DC

## 2022-11-27 MED ORDER — EZETIMIBE 10 MG PO TABS
10.0000 mg | ORAL_TABLET | Freq: Every day | ORAL | 3 refills | Status: DC
Start: 2022-11-27 — End: 2024-02-06

## 2022-11-27 MED ORDER — TRULICITY 3 MG/0.5ML ~~LOC~~ SOAJ: SUBCUTANEOUS | 1 refills | Status: DC

## 2022-11-27 MED ORDER — METFORMIN HCL 1000 MG PO TABS: 1000.0000 mg | ORAL_TABLET | Freq: Two times a day (BID) | ORAL | 3 refills | Status: DC

## 2022-11-27 MED ORDER — ALBUTEROL SULFATE (2.5 MG/3ML) 0.083% IN NEBU
2.5000 mg | INHALATION_SOLUTION | Freq: Four times a day (QID) | RESPIRATORY_TRACT | 1 refills | Status: DC | PRN
Start: 2022-11-27 — End: 2024-02-09

## 2022-11-27 MED ORDER — DESVENLAFAXINE SUCCINATE ER 50 MG PO TB24
50.0000 mg | ORAL_TABLET | Freq: Every day | ORAL | 3 refills | Status: DC
Start: 2022-11-27 — End: 2023-11-19

## 2022-11-27 NOTE — Progress Notes (Signed)
Established Patient Office Visit  Subjective   Patient ID: Barbara Barber, female    DOB: January 22, 1971  Age: 52 y.o. MRN: [id number]  Chief Complaint  Patient presents with   Medical Management of Chronic Issues   Diabetes    Diabetes    DM Pt presents for  follow up evaluation of Type 2 diabetes mellitus. Patient denies foot ulcerations, increased appetite, nausea, paresthesia of the feet, polydipsia, polyuria, visual disturbances, vomiting, and weight loss.  Current diabetic medications include metformin, trulicity 3 mg Compliant with meds - Yes  Current monitoring regimen: none  Is She on ACE inhibitor or angiotensin II receptor blocker?  No, declines Is She on statin? No   2. HLD Was previously on crestor but had rash with on her feet with swelling in her feet. Reports it was a severe reaction. Last LDL was 176. Reports diet is up and down. Does not exercise.   3. SVT Recently saw cardiology. They changed her to metoprolol recently.   4. COPD On breo currently. Has to use albuterol occasionally. Denies chronic cough. Reprots dx years ago.   5. Depression and anxiety Previously on cymbalta but it didn't seem to help that much after awhile.      11/27/2022    3:57 PM 05/29/2022    4:20 PM 11/14/2021    2:32 PM  Depression screen PHQ 2/9  Decreased Interest 2 0 1  Down, Depressed, Hopeless 1 0 0  PHQ - 2 Score 3 0 1  Altered sleeping 3 2 2   Tired, decreased energy 3 1 2   Change in appetite 2 2 2   Feeling bad or failure about yourself  0 0 0  Trouble concentrating 0 0 0  Moving slowly or fidgety/restless 1 0 1  Suicidal thoughts 0  0  PHQ-9 Score 12 5 8   Difficult doing work/chores Somewhat difficult Not difficult at all Somewhat difficult      11/27/2022    3:58 PM 05/29/2022    4:20 PM 11/14/2021    2:33 PM 08/24/2021    3:38 PM  GAD 7 : Generalized Anxiety Score  Nervous, Anxious, on Edge 1 1 1 1   Control/stop worrying 1 1 1 1   Worry too much -  different things 2 0 1 1  Trouble relaxing 2 0 1 1  Restless 1 0 1 0  Easily annoyed or irritable 2 1 2  0  Afraid - awful might happen 0 0 1 0  Total GAD 7 Score 9 3 8 4   Anxiety Difficulty Somewhat difficult Somewhat difficult Somewhat difficult Not difficult at all       ROS As per HPI.    Objective:     BP 100/63   Pulse 87   Temp 97.6 F (36.4 C) (Temporal)   Ht 5\' 4"  (1.626 m)   Wt 194 lb (88 kg)   SpO2 93%   BMI 33.30 kg/m  BP Readings from Last 3 Encounters:  11/27/22 100/63  06/27/22 120/80  05/29/22 114/79     Physical Exam Vitals and nursing note reviewed.  Constitutional:      General: She is not in acute distress.    Appearance: Normal appearance. She is not ill-appearing, toxic-appearing or diaphoretic.  Cardiovascular:     Rate and Rhythm: Normal rate and regular rhythm.     Heart sounds: Normal heart sounds. No murmur heard. Musculoskeletal:     Cervical back: Neck supple. No rigidity.     Right lower leg: No  edema.     Left lower leg: No edema.  Skin:    General: Skin is warm and dry.  Neurological:     General: No focal deficit present.     Mental Status: She is alert and oriented to person, place, and time.  Psychiatric:        Mood and Affect: Mood normal.        Behavior: Behavior normal.        Thought Content: Thought content normal.        Judgment: Judgment normal.      No results found for any visits on 11/27/22.    The 10-year ASCVD risk score (Arnett DK, et al., 2019) is: 6.1%    Assessment & Plan:   Danisa was seen today for medical management of chronic issues and diabetes.  Diagnoses and all orders for this visit:  Type 2 diabetes mellitus without complication, without long-term current use of insulin (HCC) A1c pending. Previously at goal. Statin allergy. ACE allergy. Continue metformin and trulicity pending A1c results. Reminded to schedule eye exam. Foot exam and urine micro are UTD.  -     Bayer DCA Hb A1c  Waived -     Vitamin B12 -     metFORMIN (GLUCOPHAGE) 1000 MG tablet; Take 1 tablet (1,000 mg total) by mouth 2 (two) times daily with a meal. -     Dulaglutide (TRULICITY) 3 MG/0.5ML SOPN; INJECT 3 MG INTO THE SKIN ONCE A WEEK  Hyperlipidemia associated with type 2 diabetes mellitus (HCC) Uncontrolled. Allergy to statin. Start zetia as below. Will repeat fasting lipid panel in 3 months.  -     ezetimibe (ZETIA) 10 MG tablet; Take 1 tablet (10 mg total) by mouth daily.  Statin intolerance  Class 1 obesity due to excess calories with serious comorbidity and body mass index (BMI) of 33.0 to 33.9 in adult Diet and exercise.   Chronic obstructive pulmonary disease, unspecified COPD type (HCC) Well controlled on current regimen.  -     BREO ELLIPTA 100-25 MCG/ACT AEPB; Inhale 1 puff by mouth once daily -     albuterol (PROVENTIL) (2.5 MG/3ML) 0.083% nebulizer solution; Take 3 mLs (2.5 mg total) by nebulization every 6 (six) hours as needed for wheezing or shortness of breath.  SVT (supraventricular tachycardia) Managed by cardiology. On metoprolol.   Depression, recurrent (HCC) Anxiety Uncontrolled. Denies SI. Start pristiq as below. Follow up in 6 weeks, sooner for new or worsening symptoms.  -     desvenlafaxine (PRISTIQ) 50 MG 24 hr tablet; Take 1 tablet (50 mg total) by mouth daily.  Return in about 6 weeks (around 01/08/2023) for medication follow up.   The patient indicates understanding of these issues and agrees with the plan.  Gabriel Earing, FNP

## 2022-11-28 LAB — VITAMIN B12: Vitamin B-12: 354 pg/mL (ref 232–1245)

## 2022-12-12 ENCOUNTER — Other Ambulatory Visit: Payer: Self-pay | Admitting: Family Medicine

## 2022-12-12 DIAGNOSIS — G8929 Other chronic pain: Secondary | ICD-10-CM

## 2022-12-15 ENCOUNTER — Other Ambulatory Visit: Payer: Self-pay | Admitting: Family Medicine

## 2022-12-15 DIAGNOSIS — J069 Acute upper respiratory infection, unspecified: Secondary | ICD-10-CM

## 2022-12-15 DIAGNOSIS — J449 Chronic obstructive pulmonary disease, unspecified: Secondary | ICD-10-CM

## 2023-01-30 ENCOUNTER — Encounter: Payer: Self-pay | Admitting: Family Medicine

## 2023-01-30 ENCOUNTER — Ambulatory Visit: Payer: BC Managed Care – PPO | Admitting: Family Medicine

## 2023-01-30 VITALS — BP 113/70 | HR 78 | Temp 97.9°F | Ht 64.0 in | Wt 196.6 lb

## 2023-01-30 DIAGNOSIS — E1169 Type 2 diabetes mellitus with other specified complication: Secondary | ICD-10-CM

## 2023-01-30 DIAGNOSIS — F339 Major depressive disorder, recurrent, unspecified: Secondary | ICD-10-CM

## 2023-01-30 DIAGNOSIS — E785 Hyperlipidemia, unspecified: Secondary | ICD-10-CM

## 2023-01-30 DIAGNOSIS — Z6833 Body mass index (BMI) 33.0-33.9, adult: Secondary | ICD-10-CM

## 2023-01-30 DIAGNOSIS — E1165 Type 2 diabetes mellitus with hyperglycemia: Secondary | ICD-10-CM | POA: Diagnosis not present

## 2023-01-30 DIAGNOSIS — Z789 Other specified health status: Secondary | ICD-10-CM

## 2023-01-30 DIAGNOSIS — E6609 Other obesity due to excess calories: Secondary | ICD-10-CM | POA: Diagnosis not present

## 2023-01-30 DIAGNOSIS — F5104 Psychophysiologic insomnia: Secondary | ICD-10-CM

## 2023-01-30 DIAGNOSIS — F419 Anxiety disorder, unspecified: Secondary | ICD-10-CM

## 2023-01-30 MED ORDER — TRAZODONE HCL 50 MG PO TABS
25.0000 mg | ORAL_TABLET | Freq: Every evening | ORAL | 3 refills | Status: DC | PRN
Start: 2023-01-30 — End: 2024-02-09

## 2023-01-30 NOTE — Progress Notes (Signed)
Established Patient Office Visit  Subjective   Patient ID: Barbara Barber, female    DOB: 1971/03/18  Age: 52 y.o. MRN: [id number]  Chief Complaint  Patient presents with   Medical Management of Chronic Issues    Metformin follow up    HPI  DM Pt presents for follow up evaluation of Type 2 diabetes mellitus.  Current symptoms include increase appetite. Patient denies foot ulcerations, nausea, paresthesia of the feet, polydipsia, polyuria, visual disturbances, vomiting, and weight loss.  She has been feeling very hungry lately. Has been off of trulcity for the last few months. This was discontinued due to supply issues. Her last A1c was very well controlled without trulicity.   Current diabetic medications include: metformin 1000 mg BID Compliant with meds - Yes  Current monitoring regimen: none  Is She on ACE inhibitor or angiotensin II receptor blocker?  No, intolerance Is She on statin? No intolerance  2.  HLD Compliant with zetia, denies side effects. Regular diet, no exercise. Statin intolerance.   3. Anxiety/depression Pristiq has been very helpful without side effects. She does have trouble sleeping and staying asleep. Sometimes due to pain, sometimes due to anxiety. She has tried and failed melatonin and multiple OTC medications without relief.      01/30/2023   10:43 AM 11/27/2022    3:57 PM 05/29/2022    4:20 PM  Depression screen PHQ 2/9  Decreased Interest 0 2 0  Down, Depressed, Hopeless 0 1 0  PHQ - 2 Score 0 3 0  Altered sleeping 3 3 2   Tired, decreased energy 1 3 1   Change in appetite 0 2 2  Feeling bad or failure about yourself  0 0 0  Trouble concentrating 1 0 0  Moving slowly or fidgety/restless 0 1 0  Suicidal thoughts 0 0   PHQ-9 Score 5 12 5   Difficult doing work/chores Somewhat difficult Somewhat difficult Not difficult at all      01/30/2023   10:43 AM 11/27/2022    3:58 PM 05/29/2022    4:20 PM 11/14/2021    2:33 PM  GAD 7 : Generalized  Anxiety Score  Nervous, Anxious, on Edge 0 1 1 1   Control/stop worrying 0 1 1 1   Worry too much - different things 0 2 0 1  Trouble relaxing 3 2 0 1  Restless 1 1 0 1  Easily annoyed or irritable 0 2 1 2   Afraid - awful might happen 0 0 0 1  Total GAD 7 Score 4 9 3 8   Anxiety Difficulty Somewhat difficult Somewhat difficult Somewhat difficult Somewhat difficult       ROS As per HPI.    Objective:     BP 113/70   Pulse 78   Temp 97.9 F (36.6 C) (Temporal)   Ht 5\' 4"  (1.626 m)   Wt 196 lb 9.6 oz (89.2 kg)   SpO2 96%   BMI 33.75 kg/m  Wt Readings from Last 3 Encounters:  01/30/23 196 lb 9.6 oz (89.2 kg)  11/27/22 194 lb (88 kg)  06/27/22 194 lb 3.2 oz (88.1 kg)      Physical Exam Vitals and nursing note reviewed.  Constitutional:      General: She is not in acute distress.    Appearance: Normal appearance. She is not ill-appearing, toxic-appearing or diaphoretic.  Cardiovascular:     Rate and Rhythm: Normal rate and regular rhythm.     Heart sounds: Normal heart sounds. No murmur heard. Musculoskeletal:  Cervical back: Neck supple. No rigidity.     Right lower leg: No edema.     Left lower leg: No edema.  Skin:    General: Skin is warm and dry.  Neurological:     General: No focal deficit present.     Mental Status: She is alert and oriented to person, place, and time.  Psychiatric:        Mood and Affect: Mood normal.        Behavior: Behavior normal.        Thought Content: Thought content normal.        Judgment: Judgment normal.      No results found for any visits on 01/30/23.    The 10-year ASCVD risk score (Arnett DK, et al., 2019) is: 7.7%    Assessment & Plan:   Barbara Barber was seen today for medical management of chronic issues.  Diagnoses and all orders for this visit:  Type 2 diabetes mellitus with hyperglycemia, without long-term current use of insulin (HCC) She will return for fasting labs at a later date. Last A1c was well  controlled. Currently only on metformin. Stopped trulicity due to supply. Discussed ozempic for DM. She would like to see what her A1c is before deciding. Intolerant to statins and ACE/ARB.  -     CBC with Differential/Platelet; Future -     CMP14+EGFR; Future -     Lipid panel; Future -     Microalbumin / creatinine urine ratio; Future -     Bayer DCA Hb A1c Waived; Future  Hyperlipidemia associated with type 2 diabetes mellitus (HCC) Statin intolerance Will return for fasting lipid panel after starting zetia. Statin intolerant.  -     CBC with Differential/Platelet; Future -     CMP14+EGFR; Future -     Lipid panel; Future  Class 1 obesity due to excess calories with serious comorbidity and body mass index (BMI) of 33.0 to 33.9 in adult Weight stable. Discussed diet and exercise.  -     CBC with Differential/Platelet; Future -     CMP14+EGFR; Future -     Lipid panel; Future  Anxiety Depression, recurrent (HCC) Well controlled on current regimen. Continue pristiq.   Psychophysiological insomnia Will try a low dose of trazodone.  -     traZODone (DESYREL) 50 MG tablet; Take 0.5-1 tablets (25-50 mg total) by mouth at bedtime as needed for sleep.   Will determine follow up pending labs. She will let me know how she is doing with trazodone in 2-3 weeks   The patient indicates understanding of these issues and agrees with the plan.   Gabriel Earing, FNP

## 2023-02-27 ENCOUNTER — Other Ambulatory Visit: Payer: BC Managed Care – PPO

## 2023-02-27 DIAGNOSIS — E785 Hyperlipidemia, unspecified: Secondary | ICD-10-CM

## 2023-02-27 DIAGNOSIS — E1165 Type 2 diabetes mellitus with hyperglycemia: Secondary | ICD-10-CM

## 2023-02-27 DIAGNOSIS — E6609 Other obesity due to excess calories: Secondary | ICD-10-CM

## 2023-02-27 LAB — BAYER DCA HB A1C WAIVED: HB A1C (BAYER DCA - WAIVED): 8.8 % — ABNORMAL HIGH (ref 4.8–5.6)

## 2023-02-28 ENCOUNTER — Other Ambulatory Visit: Payer: BC Managed Care – PPO

## 2023-02-28 ENCOUNTER — Other Ambulatory Visit: Payer: Self-pay | Admitting: Family Medicine

## 2023-02-28 DIAGNOSIS — E1165 Type 2 diabetes mellitus with hyperglycemia: Secondary | ICD-10-CM

## 2023-02-28 LAB — LIPID PANEL
Chol/HDL Ratio: 4.7 ratio — ABNORMAL HIGH (ref 0.0–4.4)
Cholesterol, Total: 234 mg/dL — ABNORMAL HIGH (ref 100–199)
HDL: 50 mg/dL (ref >39)
LDL Chol Calc (NIH): 162 mg/dL — ABNORMAL HIGH (ref 0–99)
Triglycerides: 124 mg/dL (ref 0–149)
VLDL Cholesterol Cal: 22 mg/dL (ref 5–40)

## 2023-02-28 LAB — CBC WITH DIFFERENTIAL/PLATELET
Basophils Absolute: 0.1 10*3/uL (ref 0.0–0.2)
Basos: 1 %
EOS (ABSOLUTE): 0.6 10*3/uL — ABNORMAL HIGH (ref 0.0–0.4)
Eos: 7 %
Hematocrit: 38.4 % (ref 34.0–46.6)
Hemoglobin: 12.8 g/dL (ref 11.1–15.9)
Immature Grans (Abs): 0 10*3/uL (ref 0.0–0.1)
Immature Granulocytes: 1 %
Lymphocytes Absolute: 2.2 10*3/uL (ref 0.7–3.1)
Lymphs: 28 %
MCH: 29.8 pg (ref 26.6–33.0)
MCHC: 33.3 g/dL (ref 31.5–35.7)
MCV: 89 fL (ref 79–97)
Monocytes Absolute: 0.4 10*3/uL (ref 0.1–0.9)
Monocytes: 5 %
Neutrophils Absolute: 4.8 10*3/uL (ref 1.4–7.0)
Neutrophils: 58 %
Platelets: 283 10*3/uL (ref 150–450)
RBC: 4.3 x10E6/uL (ref 3.77–5.28)
RDW: 13.4 % (ref 11.7–15.4)
WBC: 8.1 10*3/uL (ref 3.4–10.8)

## 2023-02-28 LAB — MICROALBUMIN / CREATININE URINE RATIO
Creatinine, Urine: 105.8 mg/dL
Microalb/Creat Ratio: <3 mg/g{creat} (ref 0–29)
Microalbumin, Urine: <3 ug/mL

## 2023-02-28 LAB — CMP14+EGFR
ALT: 14 IU/L (ref 0–32)
AST: 15 IU/L (ref 0–40)
Albumin: 4.4 g/dL (ref 3.8–4.9)
Alkaline Phosphatase: 93 IU/L (ref 44–121)
BUN/Creatinine Ratio: 15 (ref 9–23)
BUN: 10 mg/dL (ref 6–24)
Bilirubin Total: 0.2 mg/dL (ref 0.0–1.2)
CO2: 22 mmol/L (ref 20–29)
Calcium: 9.2 mg/dL (ref 8.7–10.2)
Chloride: 103 mmol/L (ref 96–106)
Creatinine, Ser: 0.68 mg/dL (ref 0.57–1.00)
Globulin, Total: 2.4 g/dL (ref 1.5–4.5)
Glucose: 204 mg/dL — ABNORMAL HIGH (ref 70–99)
Potassium: 4.5 mmol/L (ref 3.5–5.2)
Sodium: 141 mmol/L (ref 134–144)
Total Protein: 6.8 g/dL (ref 6.0–8.5)
eGFR: 105 mL/min/{1.73_m2} (ref >59)

## 2023-02-28 MED ORDER — SEMAGLUTIDE(0.25 OR 0.5MG/DOS) 2 MG/3ML ~~LOC~~ SOPN: PEN_INJECTOR | SUBCUTANEOUS | 3 refills | Status: DC

## 2023-03-16 ENCOUNTER — Other Ambulatory Visit: Payer: Self-pay | Admitting: Family Medicine

## 2023-03-16 DIAGNOSIS — J449 Chronic obstructive pulmonary disease, unspecified: Secondary | ICD-10-CM

## 2023-04-06 ENCOUNTER — Other Ambulatory Visit: Payer: Self-pay | Admitting: Internal Medicine

## 2023-04-08 ENCOUNTER — Other Ambulatory Visit (HOSPITAL_COMMUNITY): Payer: Self-pay

## 2023-04-08 ENCOUNTER — Telehealth: Payer: Self-pay

## 2023-04-08 NOTE — Telephone Encounter (Addendum)
Pharmacy Patient Advocate Encounter Received notification via [facility]  Insurance verification completed.    The patient is insured through [facility] ADVANTAGE/RX ADVANCE   Ran test claim for [id number]. Currently a quantity of is a 84 day supply and the co-pay is 74.99 No P/A is needed at this time .   This test claim was processed through [facility]- copay amounts may vary at other pharmacies due to pharmacy/plan contracts, or as the patient moves through the different stages of their insurance plan.

## 2023-04-11 ENCOUNTER — Telehealth: Payer: Self-pay | Admitting: Pharmacist

## 2023-04-14 ENCOUNTER — Encounter: Payer: Self-pay | Admitting: Internal Medicine

## 2023-04-14 ENCOUNTER — Ambulatory Visit: Payer: BC Managed Care – PPO | Attending: Internal Medicine | Admitting: Internal Medicine

## 2023-04-14 VITALS — BP 128/82 | HR 86 | Ht 64.5 in | Wt 203.6 lb

## 2023-04-14 DIAGNOSIS — I471 Supraventricular tachycardia, unspecified: Secondary | ICD-10-CM

## 2023-04-14 MED ORDER — DILTIAZEM HCL 60 MG PO TABS: 60.0000 mg | ORAL_TABLET | ORAL | 4 refills | Status: AC | PRN

## 2023-04-14 NOTE — Patient Instructions (Addendum)
Medication Instructions:  Your physician has recommended you make the following change in your medication:  Start taking Diltiazem every 8 hours as needed for Palpitations  Continue all other medications as prescribed   Labwork: None   Testing/Procedures: None  Follow-Up: Your physician recommends that you schedule a follow-up appointment in: 1 Year   Any Other Special Instructions Will Be Listed Below (If Applicable).  If you need a refill on your cardiac medications before your next appointment, please call your pharmacy.

## 2023-04-14 NOTE — Progress Notes (Signed)
Cardiology Office Note  Date: 04/14/2023   ID: Barbara, Barber 01-19-1971, MRN [id number]  PCP:  Gabriel Earing, FNP  Cardiologist:  Marjo Bicker, MD Electrophysiologist:  None     History of Present Illness: Barbara Barber is a 52 y.o. female known to have SVT, DM 2 presented to cardiology clinic for follow-up visit of her SVT.   Interval ER visit on 04/12/2023 for SVT at [facility] where she had to be given IV adenosine x 2 to break the rhythm.  She reported drinking too much caffeine the day prior to the SVT episode.  She tried vagal maneuvers at home but did not resolve and hence had to go to the ER.  No recurrence since then.  No other symptoms of chest pain, DOE, orthopnea, PND, dizziness, presyncope and syncope.  Currently on metoprolol titrate 25 mg twice daily for symptomatic relief, will add diltiazem 60 mg every 8 hours as needed for palpitations.  Past Medical History:  Diagnosis Date   Asthma    Bulging lumbar disc    COPD (chronic obstructive pulmonary disease) (HCC)    Diabetes mellitus without complication (HCC)    Fibromyalgia    Fibromyalgia    Hyperlipidemia    Lumbar herniated disc    X4   Migraines     Past Surgical History:  Procedure Laterality Date   APPENDECTOMY      Current Outpatient Medications  Medication Sig Dispense Refill   albuterol (PROVENTIL) (2.5 MG/3ML) 0.083% nebulizer solution Take 3 mLs (2.5 mg total) by nebulization every 6 (six) hours as needed for wheezing or shortness of breath. 150 mL 1   albuterol (VENTOLIN HFA) 108 (90 Base) MCG/ACT inhaler INHALE 2 PUFFS BY MOUTH EVERY 6 HOURS AS NEEDED FOR WHEEZING FOR SHORTNESS OF BREATH 18 g 2   BREO ELLIPTA 100-25 MCG/ACT AEPB Inhale 1 puff by mouth once daily 60 each 0   cyclobenzaprine (FLEXERIL) 5 MG tablet Take 1 tablet by mouth three times daily as needed for muscle spasm 30 tablet 2   desvenlafaxine (PRISTIQ) 50 MG 24 hr tablet Take 1 tablet (50 mg total) by  mouth daily. 90 tablet 3   ezetimibe (ZETIA) 10 MG tablet Take 1 tablet (10 mg total) by mouth daily. 90 tablet 3   metFORMIN (GLUCOPHAGE) 1000 MG tablet Take 1 tablet (1,000 mg total) by mouth 2 (two) times daily with a meal. 180 tablet 3   metoprolol tartrate (LOPRESSOR) 25 MG tablet TAKE 1 TABLET BY MOUTH TWICE DAILY. STOP DILTIAZEM 180 tablet 2   Semaglutide,0.25 or 0.5MG /DOS, 2 MG/3ML SOPN Inject 0.25 mg into the skin weekly x 4 weeks. Then increase to 0.5 mg weekly. 9 mL 3   traZODone (DESYREL) 50 MG tablet Take 0.5-1 tablets (25-50 mg total) by mouth at bedtime as needed for sleep. 90 tablet 3   No current facility-administered medications for this visit.   Allergies:  Crestor [rosuvastatin], Ace inhibitors, and Other   Social History: The patient  reports that she has been smoking cigarettes. She has a 12 pack-year smoking history. She has never used smokeless tobacco. She reports that she does not drink alcohol and does not use drugs.   Family History: The patient's family history includes Alcohol abuse in her paternal uncle; Diabetes in her paternal grandmother; Heart attack in her father; Heart disease in her paternal grandmother; Lung disease in her father; Pelvic inflammatory disease in her mother; Stroke in her father and maternal grandmother;  Ulcers in her father.   ROS:  Please see the history of present illness. Otherwise, complete review of systems is positive for none.  All other systems are reviewed and negative.   Physical Exam: VS:  There were no vitals taken for this visit., BMI There is no height or weight on file to calculate BMI.  Wt Readings from Last 3 Encounters:  01/30/23 196 lb 9.6 oz (89.2 kg)  11/27/22 194 lb (88 kg)  06/27/22 194 lb 3.2 oz (88.1 kg)    General: Patient appears comfortable at rest. HEENT: Conjunctiva and lids normal, oropharynx clear with moist mucosa. Neck: Supple, no elevated JVP or carotid bruits, no thyromegaly. Lungs: Clear to  auscultation, nonlabored breathing at rest. Cardiac: Regular rate and rhythm, no S3 or significant systolic murmur, no pericardial rub. Abdomen: Soft, nontender, no hepatomegaly, bowel sounds present, no guarding or rebound. Extremities: No pitting edema, distal pulses 2+. Skin: Warm and dry. Musculoskeletal: No kyphosis. Neuropsychiatric: Alert and oriented x3, affect grossly appropriate.  ECG: Normal sinus rhythm  Recent Labwork: 02/27/2023: ALT 14; AST 15; BUN 10; Creatinine, Ser 0.68; Hemoglobin 12.8; Platelets 283; Potassium 4.5; Sodium 141     Component Value Date/Time   CHOL 234 (H) 02/27/2023 1252   TRIG 124 02/27/2023 1252   HDL 50 02/27/2023 1252   CHOLHDL 4.7 (H) 02/27/2023 1252   LDLCALC 162 (H) 02/27/2023 1252    Other Studies Reviewed Today:   Assessment and Plan: Patient is a 52 year old F known to have SVT, DM 2 presented to cardiology clinic for follow-up visit.  SVT: ER visit on 04/12/2023 at [facility] for SVT s/p adenosine x 2 with successful conversion to NSR. Failed vagal maneuvers at home.  Will continue metoprolol tartrate 25 mg twice daily and add diltiazem 60 mg every 8 hours as needed for palpitations.  Will place electrophysiology referral for EP study +/- ablation.    Tests Ordered: Orders Placed This Encounter  Procedures   EKG 12-Lead    Medication Changes: No orders of the defined types were placed in this encounter.   Disposition:  Follow up  one year  Signed Safir Michalec Verne Spurr, MD, 04/14/2023 11:18 AM    [facility] [address] [place], [place], [place] [address]

## 2023-04-16 NOTE — Telephone Encounter (Addendum)
PA was done under wrong insurance or incorrectly PharmD submitted new PA Ozempic approved and pt/pharmacy was notified

## 2023-04-20 ENCOUNTER — Other Ambulatory Visit: Payer: Self-pay | Admitting: Family Medicine

## 2023-04-20 DIAGNOSIS — J449 Chronic obstructive pulmonary disease, unspecified: Secondary | ICD-10-CM

## 2023-04-29 ENCOUNTER — Other Ambulatory Visit: Payer: Self-pay | Admitting: Family Medicine

## 2023-04-29 DIAGNOSIS — J449 Chronic obstructive pulmonary disease, unspecified: Secondary | ICD-10-CM

## 2023-04-29 DIAGNOSIS — J069 Acute upper respiratory infection, unspecified: Secondary | ICD-10-CM

## 2023-05-22 ENCOUNTER — Other Ambulatory Visit: Payer: Self-pay | Admitting: Family Medicine

## 2023-05-22 DIAGNOSIS — G8929 Other chronic pain: Secondary | ICD-10-CM

## 2023-05-22 DIAGNOSIS — J449 Chronic obstructive pulmonary disease, unspecified: Secondary | ICD-10-CM

## 2023-06-02 ENCOUNTER — Encounter: Payer: Self-pay | Admitting: Cardiovascular Disease

## 2023-06-02 ENCOUNTER — Ambulatory Visit: Payer: BC Managed Care – PPO | Attending: Cardiovascular Disease | Admitting: Cardiovascular Disease

## 2023-06-02 VITALS — BP 110/72 | HR 82 | Ht 64.5 in | Wt 197.0 lb

## 2023-06-02 DIAGNOSIS — I471 Supraventricular tachycardia, unspecified: Secondary | ICD-10-CM

## 2023-06-02 NOTE — Progress Notes (Signed)
Electrophysiology Office Note:    Date:  06/02/2023   ID:  DOMINIK OWINGS, DOB 10-11-1970, MRN [id number]  PCP:  Gabriel Earing, FNP   [facility] Providers Cardiologist:  Marjo Bicker, MD     Referring MD: Marjo Bicker, MD   History of Present Illness:    Barbara Barber is a 52 y.o. female with a medical history significant for SVT, referred for arrhythmia management.     I discussed the use of AI scribe software for clinical note transcription with the patient, who gave verbal consent to proceed.  She has experienced two episodes of SVT in the past three and a half years, with the most recent episode occurring in October. During these episodes, she describes feeling as if she is having an "out of body experience," with symptoms of nervousness, stress, and difficulty catching her breath. The first episode lasted for approximately six and a half hours before she sought medical attention, while the second episode prompted her to seek medical attention within half an hour. Her heart rate during these episodes was recorded as high as 276 beats per minute. The patient has been trying various techniques such as breathing exercises to manage her symptoms but has found them to be ineffective. She has been prescribed diltiazem for her condition.     Today, she reports that she has been doing well. No additional episodes of SVT  EKGs/Labs/Other Studies Reviewed Today:     Echocardiogram:  TTE 12/01/2019 EF 60-65%. Grade I diastolic dysfunction   Monitors:  8 Day monitor --  my interpretation Sinus rhythm; no arrhythmia detected   EKG:   EKG Interpretation Date/Time:  Monday June 02 2023 15:23:12 EST Ventricular Rate:  82 PR Interval:  148 QRS Duration:  80 QT Interval:  412 QTC Calculation: 481 R Axis:   78  Text Interpretation: Normal sinus rhythm Cannot rule out Anterior infarct , age undetermined When compared with ECG of 14-Apr-2023  11:24, No significant change was found Confirmed by York Pellant [phone]) on 06/02/2023 3:55:45 PM     Physical Exam:    VS:  BP 110/72   Pulse 82   Ht 5' 4.5" (1.638 m)   Wt 197 lb (89.4 kg)   SpO2 96%   BMI 33.29 kg/m     Wt Readings from Last 3 Encounters:  06/02/23 197 lb (89.4 kg)  04/14/23 203 lb 9.6 oz (92.4 kg)  01/30/23 196 lb 9.6 oz (89.2 kg)     GEN:  Well nourished, well developed in no acute distress CARDIAC: RRR, no murmurs, rubs, gallops RESPIRATORY:  Normal work of breathing MUSCULOSKELETAL:  edema    ASSESSMENT & PLAN:     SVT Adenosine responsive Per patient, rates are actually rapid up to 270 bpm I do not have EKGs of the arrhythmia.  Will request records today Will plan for EP study and ablation  We discussed the indication, rationale, logistics, anticipated benefits, and potential risks of the ablation procedure including but not limited to -- bleed at the groin access site, chest pain, damage to nearby organs such as the diaphragm, lungs, or esophagus, need for a drainage tube, or prolonged hospitalization. I explained that the risk for stroke, heart attack, need for open chest surgery, or even death is very low but not zero. she  expressed understanding and wishes to proceed.      Signed, Maurice Small, MD  06/02/2023 7:51 PM    [facility]  Addendum: EKG documenting SVT is scanned under the media tab. Quality is poor but narrox complex tachycardia can be seen.

## 2023-06-02 NOTE — Patient Instructions (Signed)
Medication Instructions:  Your physician recommends that you continue on your current medications as directed. Please refer to the Current Medication list given to you today. *If you need a refill on your cardiac medications before your next appointment, please call your pharmacy*  Testing/Procedures: SVT Ablation - we will contact you when the April hospital schedule opens to set this up for your spring break time Your physician has recommended that you have an ablation. Catheter ablation is a medical procedure used to treat some cardiac arrhythmias (irregular heartbeats). During catheter ablation, a long, thin, flexible tube is put into a blood vessel in your groin (upper thigh), or neck. This tube is called an ablation catheter. It is then guided to your heart through the blood vessel. Radio frequency waves destroy small areas of heart tissue where abnormal heartbeats may cause an arrhythmia to start. Please see the instruction sheet given to you today.   Follow-Up: At [facility], you and your health needs are our priority.  As part of our continuing mission to provide you with exceptional heart care, we have created designated Provider Care Teams.  These Care Teams include your primary Cardiologist (physician) and Advanced Practice Providers (APPs -  Physician Assistants and Nurse Practitioners) who all work together to provide you with the care you need, when you need it.  We recommend signing up for the patient portal called "MyChart".  Sign up information is provided on this After Visit Summary.  MyChart is used to connect with patients for Virtual Visits (Telemedicine).  Patients are able to view lab/test results, encounter notes, upcoming appointments, etc.  Non-urgent messages can be sent to your provider as well.   To learn more about what you can do with MyChart, go to [email or web address].    Your next appointment:   Prior to ablation   Provider:   York Pellant, MD

## 2023-06-07 ENCOUNTER — Other Ambulatory Visit: Payer: Self-pay | Admitting: Family Medicine

## 2023-06-07 DIAGNOSIS — J449 Chronic obstructive pulmonary disease, unspecified: Secondary | ICD-10-CM

## 2023-06-07 DIAGNOSIS — J069 Acute upper respiratory infection, unspecified: Secondary | ICD-10-CM

## 2023-07-18 ENCOUNTER — Other Ambulatory Visit: Payer: Self-pay | Admitting: Family Medicine

## 2023-07-18 DIAGNOSIS — J449 Chronic obstructive pulmonary disease, unspecified: Secondary | ICD-10-CM

## 2023-07-18 DIAGNOSIS — J069 Acute upper respiratory infection, unspecified: Secondary | ICD-10-CM

## 2023-07-22 ENCOUNTER — Other Ambulatory Visit: Payer: Self-pay | Admitting: Family Medicine

## 2023-07-22 DIAGNOSIS — J449 Chronic obstructive pulmonary disease, unspecified: Secondary | ICD-10-CM

## 2023-07-22 DIAGNOSIS — J069 Acute upper respiratory infection, unspecified: Secondary | ICD-10-CM

## 2023-07-23 MED ORDER — ALBUTEROL SULFATE HFA 108 (90 BASE) MCG/ACT IN AERS: 2.0000 | INHALATION_SPRAY | Freq: Four times a day (QID) | RESPIRATORY_TRACT | 0 refills | Status: DC | PRN

## 2023-07-23 NOTE — Telephone Encounter (Signed)
 Pt says she had a bad cold and was using her inhaler more than normal but now is feeling better just needs to have an inhaler on hand and she will keep appt 07/30/2023 and also if she starts to be SOB, dizzy, lightheaded or feeling like she will pass out to go to ED for further evaluation.

## 2023-07-30 ENCOUNTER — Ambulatory Visit: Payer: 59 | Admitting: Family Medicine

## 2023-07-30 VITALS — BP 105/60 | HR 80 | Temp 97.8°F | Ht 64.5 in | Wt 196.8 lb

## 2023-07-30 DIAGNOSIS — R140 Abdominal distension (gaseous): Secondary | ICD-10-CM

## 2023-07-30 DIAGNOSIS — Z7985 Long-term (current) use of injectable non-insulin antidiabetic drugs: Secondary | ICD-10-CM

## 2023-07-30 DIAGNOSIS — E1169 Type 2 diabetes mellitus with other specified complication: Secondary | ICD-10-CM

## 2023-07-30 DIAGNOSIS — E1165 Type 2 diabetes mellitus with hyperglycemia: Secondary | ICD-10-CM | POA: Diagnosis not present

## 2023-07-30 DIAGNOSIS — R1013 Epigastric pain: Secondary | ICD-10-CM | POA: Diagnosis not present

## 2023-07-30 DIAGNOSIS — E785 Hyperlipidemia, unspecified: Secondary | ICD-10-CM

## 2023-07-30 LAB — BAYER DCA HB A1C WAIVED: HB A1C (BAYER DCA - WAIVED): 7.6 % — ABNORMAL HIGH (ref 4.8–5.6)

## 2023-07-30 MED ORDER — TRULICITY 1.5 MG/0.5ML ~~LOC~~ SOAJ: 1.5000 mg | SUBCUTANEOUS | 0 refills | Status: DC

## 2023-07-30 MED ORDER — TRULICITY 3 MG/0.5ML ~~LOC~~ SOAJ: 3.0000 mg | SUBCUTANEOUS | 3 refills | Status: AC

## 2023-07-30 NOTE — Progress Notes (Signed)
Established Patient Office Visit  Subjective   Patient ID: [name], female    DOB: [birth year]  Age: 53 y.o. MRN: [id number]  Chief Complaint  Patient presents with   Medical Management of Chronic Issues    HPI  DM Pt presents for follow up evaluation of Type 2 diabetes mellitus.  Current symptoms include increase appetite. Patient denies foot ulcerations, nausea, paresthesia of the feet, polydipsia, polyuria, visual disturbances, vomiting, and weight loss.  Current diabetic medications include: metformin 1000 mg BID, ozempic 1.5 mg Compliant with meds - Yes  She has been increasing her steps throughout the day. She has been improving her diet and increasing water intake.   2.  HLD Compliant with zetia, denies side effects. Regular diet, no exercise. Statin intolerance.   3. Stomach pain Pain in epigastric region for years. Intermittent, getting worse over the last 8 months. Reports bloating, tenderness to the area at times. Worse after eating, especially red sauce. Has nausea with eating. Sometimes has heartburn depending on diet. Sometimes has water brash. Increased belching. No constipation. Has diarrhea 4x a day some days with urgency. She has tried tums with relief of the heartburn but not pain. Symptoms worsened with ozempic. Hasn't had ozempic injection in 9 days and has been improving. Did better with trulicity.   4. Anxiety/depression Well controlled.      07/30/2023    4:01 PM 01/30/2023   10:43 AM 11/27/2022    3:57 PM  Depression screen PHQ 2/9  Decreased Interest 0 0 2  Down, Depressed, Hopeless 0 0 1  PHQ - 2 Score 0 0 3  Altered sleeping 2 3 3   Tired, decreased energy 1 1 3   Change in appetite 0 0 2  Feeling bad or failure about yourself  0 0 0  Trouble concentrating 0 1 0  Moving slowly or fidgety/restless 0 0 1  Suicidal thoughts 0 0 0  PHQ-9 Score 3 5 12   Difficult doing work/chores Not difficult at all Somewhat difficult Somewhat difficult       07/30/2023    4:01 PM 01/30/2023   10:43 AM 11/27/2022    3:58 PM 05/29/2022    4:20 PM  GAD 7 : Generalized Anxiety Score  Nervous, Anxious, on Edge 0 0 1 1  Control/stop worrying 0 0 1 1  Worry too much - different things 0 0 2 0  Trouble relaxing 1 3 2  0  Restless 0 1 1 0  Easily annoyed or irritable 0 0 2 1  Afraid - awful might happen 0 0 0 0  Total GAD 7 Score 1 4 9 3   Anxiety Difficulty Not difficult at all Somewhat difficult Somewhat difficult Somewhat difficult     ROS As per HPI.    Objective:     BP 105/60   Pulse 80   Temp 97.8 F (36.6 C) (Temporal)   Ht 5' 4.5" (1.638 m)   Wt 196 lb 12.8 oz (89.3 kg)   SpO2 96%   BMI 33.26 kg/m  Wt Readings from Last 3 Encounters:  07/30/23 196 lb 12.8 oz (89.3 kg)  06/02/23 197 lb (89.4 kg)  04/14/23 203 lb 9.6 oz (92.4 kg)      Physical Exam Vitals and nursing note reviewed.  Constitutional:      General: She is not in acute distress.    Appearance: Normal appearance. She is not ill-appearing, toxic-appearing or diaphoretic.  Cardiovascular:     Rate and Rhythm: Normal rate and  regular rhythm.     Heart sounds: Normal heart sounds. No murmur heard. Musculoskeletal:     Cervical back: Neck supple. No rigidity.     Right lower leg: No edema.     Left lower leg: No edema.  Skin:    General: Skin is warm and dry.  Neurological:     General: No focal deficit present.     Mental Status: She is alert and oriented to person, place, and time.  Psychiatric:        Mood and Affect: Mood normal.        Behavior: Behavior normal.        Thought Content: Thought content normal.        Judgment: Judgment normal.     No results found for any visits on 07/30/23.    The 10-year ASCVD risk score (Arnett DK, et al., 2019) is: 7%    Assessment & Plan:   Barbara Barber was seen today for medical management of chronic issues.  Diagnoses and all orders for this visit:  Type 2 diabetes mellitus with hyperglycemia,  without long-term current use of insulin (HCC) A1c 7.6 today, at goal of <7. Medication changes today: patient would like to switch back to Trulicity as she had fewer side effects with it. Previously was on trulicity 3 mg weekly. Start with 1.5 mg weekly after 4 weeks, increase to 3 mg weekly. Declined ACE/ARB and statin. Diet and exercise.  -     Bayer DCA Hb A1c Waived -     Dulaglutide (TRULICITY) 1.5 MG/0.5ML SOAJ; Inject 1.5 mg into the skin once a week. -     Dulaglutide (TRULICITY) 3 MG/0.5ML SOAJ; Inject 3 mg as directed once a week.  Hyperlipidemia associated with type 2 diabetes mellitus (HCC) Declined statin. On zetia.  -     Lipid panel  Epigastric pain Abdominal bloating Labs pending as below. Discussed referral vs imaging pending lab results.  -     CMP14+EGFR -     Lipase -     CMP14+EGFR -     Lipase   Return in about 3 months (around 10/28/2023) for CPE with pap.  The patient indicates understanding of these issues and agrees with the plan.   Gabriel Earing, FNP

## 2023-07-31 ENCOUNTER — Encounter: Payer: Self-pay | Admitting: Family Medicine

## 2023-07-31 LAB — LIPID PANEL
Chol/HDL Ratio: 3.9 {ratio} (ref 0.0–4.4)
Cholesterol, Total: 204 mg/dL — ABNORMAL HIGH (ref 100–199)
HDL: 52 mg/dL (ref >39)
LDL Chol Calc (NIH): 116 mg/dL — ABNORMAL HIGH (ref 0–99)
Triglycerides: 208 mg/dL — ABNORMAL HIGH (ref 0–149)
VLDL Cholesterol Cal: 36 mg/dL (ref 5–40)

## 2023-08-01 LAB — CMP14+EGFR
ALT: 11 [IU]/L (ref 0–32)
AST: 11 [IU]/L (ref 0–40)
Albumin: 4.6 g/dL (ref 3.8–4.9)
Alkaline Phosphatase: 84 [IU]/L (ref 44–121)
BUN/Creatinine Ratio: 13 (ref 9–23)
BUN: 11 mg/dL (ref 6–24)
Bilirubin Total: <0.2 mg/dL (ref 0.0–1.2)
CO2: 23 mmol/L (ref 20–29)
Calcium: 9.7 mg/dL (ref 8.7–10.2)
Chloride: 100 mmol/L (ref 96–106)
Creatinine, Ser: 0.84 mg/dL (ref 0.57–1.00)
Globulin, Total: 2.4 g/dL (ref 1.5–4.5)
Glucose: 128 mg/dL — ABNORMAL HIGH (ref 70–99)
Potassium: 4.5 mmol/L (ref 3.5–5.2)
Sodium: 139 mmol/L (ref 134–144)
Total Protein: 7 g/dL (ref 6.0–8.5)
eGFR: 84 mL/min/{1.73_m2} (ref >59)

## 2023-08-01 LAB — SPECIMEN STATUS REPORT

## 2023-08-01 LAB — LIPASE: Lipase: 112 U/L — ABNORMAL HIGH (ref 14–72)

## 2023-08-06 ENCOUNTER — Other Ambulatory Visit: Payer: Self-pay | Admitting: Family Medicine

## 2023-08-06 DIAGNOSIS — R1084 Generalized abdominal pain: Secondary | ICD-10-CM

## 2023-08-06 DIAGNOSIS — R748 Abnormal levels of other serum enzymes: Secondary | ICD-10-CM

## 2023-08-07 ENCOUNTER — Telehealth: Payer: Self-pay

## 2023-08-07 DIAGNOSIS — I471 Supraventricular tachycardia, unspecified: Secondary | ICD-10-CM

## 2023-08-07 NOTE — Telephone Encounter (Signed)
Spoke with patient, SVT ablation scheduled for 10/28/23 - patient requested procedure to be completed during spring break. Labs to be completed at the end of March. Orders placed and released.

## 2023-08-12 ENCOUNTER — Encounter: Payer: Self-pay | Admitting: Family Medicine

## 2023-08-18 ENCOUNTER — Other Ambulatory Visit: Payer: Self-pay | Admitting: Family Medicine

## 2023-08-18 DIAGNOSIS — Z1231 Encounter for screening mammogram for malignant neoplasm of breast: Secondary | ICD-10-CM

## 2023-08-25 ENCOUNTER — Ambulatory Visit
Admission: RE | Admit: 2023-08-25 | Discharge: 2023-08-25 | Disposition: A | Discharge status: Home / Self Care | Payer: Self-pay | Source: Ambulatory Visit | Attending: Family Medicine

## 2023-08-25 DIAGNOSIS — Z1231 Encounter for screening mammogram for malignant neoplasm of breast: Secondary | ICD-10-CM

## 2023-08-26 ENCOUNTER — Other Ambulatory Visit: Payer: Self-pay | Admitting: Family Medicine

## 2023-08-26 DIAGNOSIS — J449 Chronic obstructive pulmonary disease, unspecified: Secondary | ICD-10-CM

## 2023-08-26 DIAGNOSIS — J069 Acute upper respiratory infection, unspecified: Secondary | ICD-10-CM

## 2023-09-19 ENCOUNTER — Other Ambulatory Visit: Payer: Self-pay | Admitting: Family Medicine

## 2023-09-19 DIAGNOSIS — J449 Chronic obstructive pulmonary disease, unspecified: Secondary | ICD-10-CM

## 2023-09-25 ENCOUNTER — Telehealth: Payer: Self-pay | Admitting: Internal Medicine

## 2023-09-25 NOTE — Telephone Encounter (Signed)
 Advised pt that our office did not order test, so we would not be able to cancel order. Pt verbalized understanding.

## 2023-09-25 NOTE — Telephone Encounter (Signed)
 I called and spoke with pt. Her Mother passed away and lived in [place]. She will be traveling back and forth to help her Brother take care of the [place]. She would like to postpone her procedure out to July/August.   I informed her that our schedule is not open that far out but she didn't want to pick a date yet either. She will be in touch with [place] to get rescheduled as soon as she can.

## 2023-09-25 NOTE — Telephone Encounter (Signed)
 Patient stated her mother passed away and she wants to cancel procedure scheduled on 3/20.

## 2023-09-25 NOTE — Telephone Encounter (Signed)
 Patient is requesting to cancel SVT Ablation scheduled for 04/15. Pt stated she will be in [place] taking care of her mother estate and will call to reschedule when she come back to [facility].

## 2023-10-02 ENCOUNTER — Ambulatory Visit (HOSPITAL_COMMUNITY)
Admission: RE | Admit: 2023-10-02 | Discharge: 2023-10-02 | Disposition: A | Discharge status: Home / Self Care | Payer: Self-pay | Source: Ambulatory Visit | Attending: Family Medicine | Admitting: Family Medicine

## 2023-10-02 DIAGNOSIS — R748 Abnormal levels of other serum enzymes: Secondary | ICD-10-CM | POA: Diagnosis present

## 2023-10-02 DIAGNOSIS — R1084 Generalized abdominal pain: Secondary | ICD-10-CM | POA: Insufficient documentation

## 2023-10-15 ENCOUNTER — Encounter: Payer: Self-pay | Admitting: Family Medicine

## 2023-10-17 ENCOUNTER — Encounter: Payer: Self-pay | Admitting: Family Medicine

## 2023-10-28 ENCOUNTER — Encounter (HOSPITAL_COMMUNITY): Payer: Self-pay

## 2023-10-28 ENCOUNTER — Ambulatory Visit (HOSPITAL_COMMUNITY): Admit: 2023-10-28 | Payer: Self-pay | Admitting: Cardiovascular Disease

## 2023-10-28 SURGERY — SVT ABLATION
Anesthesia: General

## 2023-11-12 ENCOUNTER — Other Ambulatory Visit: Payer: Self-pay | Admitting: Family Medicine

## 2023-11-12 DIAGNOSIS — J449 Chronic obstructive pulmonary disease, unspecified: Secondary | ICD-10-CM

## 2023-11-19 ENCOUNTER — Other Ambulatory Visit: Payer: Self-pay | Admitting: Family Medicine

## 2023-11-19 DIAGNOSIS — F339 Major depressive disorder, recurrent, unspecified: Secondary | ICD-10-CM

## 2023-11-19 DIAGNOSIS — F419 Anxiety disorder, unspecified: Secondary | ICD-10-CM

## 2023-11-24 ENCOUNTER — Encounter: Payer: 59 | Admitting: Family Medicine

## 2023-12-12 ENCOUNTER — Other Ambulatory Visit: Payer: Self-pay | Admitting: Family Medicine

## 2023-12-12 DIAGNOSIS — J069 Acute upper respiratory infection, unspecified: Secondary | ICD-10-CM

## 2023-12-12 DIAGNOSIS — J449 Chronic obstructive pulmonary disease, unspecified: Secondary | ICD-10-CM

## 2023-12-23 ENCOUNTER — Other Ambulatory Visit: Payer: Self-pay | Admitting: Family Medicine

## 2023-12-23 DIAGNOSIS — J449 Chronic obstructive pulmonary disease, unspecified: Secondary | ICD-10-CM

## 2024-01-13 ENCOUNTER — Other Ambulatory Visit: Payer: Self-pay | Admitting: Family Medicine

## 2024-01-13 ENCOUNTER — Encounter: Payer: Self-pay | Admitting: Family Medicine

## 2024-01-13 DIAGNOSIS — J449 Chronic obstructive pulmonary disease, unspecified: Secondary | ICD-10-CM

## 2024-01-13 DIAGNOSIS — J069 Acute upper respiratory infection, unspecified: Secondary | ICD-10-CM

## 2024-01-13 NOTE — Telephone Encounter (Signed)
 LETTER MAILED

## 2024-01-13 NOTE — Telephone Encounter (Signed)
 Tiffany pt NTBS 30-d given 12/12/23

## 2024-01-15 ENCOUNTER — Encounter: Payer: Self-pay | Admitting: Family Medicine

## 2024-01-15 ENCOUNTER — Other Ambulatory Visit: Payer: Self-pay

## 2024-01-15 DIAGNOSIS — J069 Acute upper respiratory infection, unspecified: Secondary | ICD-10-CM

## 2024-01-15 DIAGNOSIS — J449 Chronic obstructive pulmonary disease, unspecified: Secondary | ICD-10-CM

## 2024-01-15 MED ORDER — BREO ELLIPTA 100-25 MCG/ACT IN AEPB: INHALATION_SPRAY | RESPIRATORY_TRACT | 0 refills | Status: DC

## 2024-01-15 MED ORDER — ALBUTEROL SULFATE HFA 108 (90 BASE) MCG/ACT IN AERS: 2.0000 | INHALATION_SPRAY | Freq: Four times a day (QID) | RESPIRATORY_TRACT | 0 refills | Status: DC | PRN

## 2024-01-29 ENCOUNTER — Other Ambulatory Visit: Payer: Self-pay | Admitting: Internal Medicine

## 2024-02-06 ENCOUNTER — Other Ambulatory Visit: Payer: Self-pay | Admitting: Family Medicine

## 2024-02-06 DIAGNOSIS — E1169 Type 2 diabetes mellitus with other specified complication: Secondary | ICD-10-CM

## 2024-02-09 ENCOUNTER — Ambulatory Visit: Admitting: Family Medicine

## 2024-02-09 ENCOUNTER — Encounter: Payer: Self-pay | Admitting: Family Medicine

## 2024-02-09 VITALS — BP 91/61 | HR 65 | Temp 98.2°F | Ht 64.5 in | Wt 194.8 lb

## 2024-02-09 DIAGNOSIS — J449 Chronic obstructive pulmonary disease, unspecified: Secondary | ICD-10-CM

## 2024-02-09 DIAGNOSIS — E66811 Obesity, class 1: Secondary | ICD-10-CM

## 2024-02-09 DIAGNOSIS — M5442 Lumbago with sciatica, left side: Secondary | ICD-10-CM

## 2024-02-09 DIAGNOSIS — E6609 Other obesity due to excess calories: Secondary | ICD-10-CM

## 2024-02-09 DIAGNOSIS — Z789 Other specified health status: Secondary | ICD-10-CM

## 2024-02-09 DIAGNOSIS — Z6832 Body mass index (BMI) 32.0-32.9, adult: Secondary | ICD-10-CM

## 2024-02-09 DIAGNOSIS — E1165 Type 2 diabetes mellitus with hyperglycemia: Secondary | ICD-10-CM

## 2024-02-09 DIAGNOSIS — E785 Hyperlipidemia, unspecified: Secondary | ICD-10-CM

## 2024-02-09 DIAGNOSIS — R21 Rash and other nonspecific skin eruption: Secondary | ICD-10-CM

## 2024-02-09 DIAGNOSIS — J309 Allergic rhinitis, unspecified: Secondary | ICD-10-CM

## 2024-02-09 DIAGNOSIS — E1169 Type 2 diabetes mellitus with other specified complication: Secondary | ICD-10-CM | POA: Diagnosis not present

## 2024-02-09 DIAGNOSIS — F339 Major depressive disorder, recurrent, unspecified: Secondary | ICD-10-CM

## 2024-02-09 DIAGNOSIS — H60311 Diffuse otitis externa, right ear: Secondary | ICD-10-CM

## 2024-02-09 DIAGNOSIS — G8929 Other chronic pain: Secondary | ICD-10-CM

## 2024-02-09 DIAGNOSIS — F5104 Psychophysiologic insomnia: Secondary | ICD-10-CM

## 2024-02-09 DIAGNOSIS — M5441 Lumbago with sciatica, right side: Secondary | ICD-10-CM

## 2024-02-09 DIAGNOSIS — F419 Anxiety disorder, unspecified: Secondary | ICD-10-CM

## 2024-02-09 LAB — BAYER DCA HB A1C WAIVED: HB A1C (BAYER DCA - WAIVED): 6.8 % — ABNORMAL HIGH (ref 4.8–5.6)

## 2024-02-09 MED ORDER — FREESTYLE LIBRE 3 PLUS SENSOR MISC: 3 refills | Status: DC

## 2024-02-09 MED ORDER — TRAZODONE HCL 50 MG PO TABS: 25.0000 mg | ORAL_TABLET | Freq: Every evening | ORAL | 3 refills | Status: AC | PRN

## 2024-02-09 MED ORDER — DESVENLAFAXINE SUCCINATE ER 50 MG PO TB24: 50.0000 mg | ORAL_TABLET | Freq: Every day | ORAL | 3 refills | Status: DC

## 2024-02-09 MED ORDER — OFLOXACIN 0.3 % OT SOLN: 10.0000 [drp] | Freq: Every day | OTIC | 0 refills | Status: AC

## 2024-02-09 MED ORDER — EZETIMIBE 10 MG PO TABS: 10.0000 mg | ORAL_TABLET | Freq: Every day | ORAL | 3 refills | Status: AC

## 2024-02-09 MED ORDER — TRIAMCINOLONE ACETONIDE 0.1 % EX CREA: 1.0000 | TOPICAL_CREAM | Freq: Two times a day (BID) | CUTANEOUS | 3 refills | Status: AC

## 2024-02-09 MED ORDER — ALBUTEROL SULFATE (2.5 MG/3ML) 0.083% IN NEBU: 2.5000 mg | INHALATION_SOLUTION | Freq: Four times a day (QID) | RESPIRATORY_TRACT | 1 refills | Status: AC | PRN

## 2024-02-09 MED ORDER — ALBUTEROL SULFATE HFA 108 (90 BASE) MCG/ACT IN AERS: 2.0000 | INHALATION_SPRAY | Freq: Four times a day (QID) | RESPIRATORY_TRACT | 5 refills | Status: AC | PRN

## 2024-02-09 MED ORDER — BREO ELLIPTA 100-25 MCG/ACT IN AEPB: INHALATION_SPRAY | RESPIRATORY_TRACT | 5 refills | Status: AC

## 2024-02-09 MED ORDER — CYCLOBENZAPRINE HCL 5 MG PO TABS: 5.0000 mg | ORAL_TABLET | Freq: Three times a day (TID) | ORAL | 3 refills | Status: AC | PRN

## 2024-02-09 MED ORDER — METFORMIN HCL 1000 MG PO TABS: 1000.0000 mg | ORAL_TABLET | Freq: Two times a day (BID) | ORAL | 3 refills | Status: AC

## 2024-02-09 MED ORDER — LEVOCETIRIZINE DIHYDROCHLORIDE 5 MG PO TABS
5.0000 mg | ORAL_TABLET | Freq: Every evening | ORAL | 3 refills | Status: AC
Start: 2024-02-09 — End: ?

## 2024-02-09 NOTE — Progress Notes (Signed)
 Established Patient Office Visit  Subjective   Patient ID: Barbara Barber, female    DOB: 1971/02/08  Age: 53 y.o. MRN: [id number]  Chief Complaint  Patient presents with   Medical Management of Chronic Issues    HPI  DM Pt presents for follow up evaluation of Type 2 diabetes mellitus.  Current symptoms include increase appetite. Patient denies foot ulcerations, nausea, paresthesia of the feet, polydipsia, polyuria, visual disturbances, vomiting, and weight loss.  Current diabetic medications include: metformin  1000 mg BID, trulicity  3 mg Compliant with meds - Yes  2.  HLD Compliant with zetia , denies side effects. Regular diet, no exercise. Statin intolerance.    3. COPD Stopped smoking 3 months ago. Symptoms have been well controlled since she quit smoking. Compliant with breo.    4. Anxiety/depression Well controlled.   5. Itchy ears For 6 weeks. Mostly right ear. Intermittent pain in left ear. No drainage. Itchy eyes intermittently. She takes xyzal  occasionally. Has use her finger and scissors to scratch her ear.   6. Rash On abdomen. Comes and goes for years. It is itchy, dry. Gets red. Has tried bacterial and anti itch spray without relief. Also tried aloe.      02/09/2024   10:42 AM 07/30/2023    4:01 PM 01/30/2023   10:43 AM  Depression screen PHQ 2/9  Decreased Interest 0 0 0  Down, Depressed, Hopeless 0 0 0  PHQ - 2 Score 0 0 0  Altered sleeping 0 2 3  Tired, decreased energy 1 1 1   Change in appetite 1 0 0  Feeling bad or failure about yourself  0 0 0  Trouble concentrating 0 0 1  Moving slowly or fidgety/restless 0 0 0  Suicidal thoughts 0 0 0  PHQ-9 Score 2 3 5   Difficult doing work/chores Not difficult at all Not difficult at all Somewhat difficult      02/09/2024   10:42 AM 07/30/2023    4:01 PM 01/30/2023   10:43 AM 11/27/2022    3:58 PM  GAD 7 : Generalized Anxiety Score  Nervous, Anxious, on Edge 0 0 0 1  Control/stop worrying 0 0 0 1   Worry too much - different things 0 0 0 2  Trouble relaxing 1 1 3 2   Restless 0 0 1 1  Easily annoyed or irritable 1 0 0 2  Afraid - awful might happen 0 0 0 0  Total GAD 7 Score 2 1 4 9   Anxiety Difficulty Not difficult at all Not difficult at all Somewhat difficult Somewhat difficult     ROS As per HPI.    Objective:     BP 91/61   Pulse 65   Temp 98.2 F (36.8 C) (Temporal)   Ht 5' 4.5 (1.638 m)   Wt 194 lb 12.8 oz (88.4 kg)   SpO2 97%   BMI 32.92 kg/m  Wt Readings from Last 3 Encounters:  02/09/24 194 lb 12.8 oz (88.4 kg)  07/30/23 196 lb 12.8 oz (89.3 kg)  06/02/23 197 lb (89.4 kg)      Physical Exam Vitals and nursing note reviewed.  Constitutional:      General: She is not in acute distress.    Appearance: Normal appearance. She is not ill-appearing, toxic-appearing or diaphoretic.  HENT:     Right Ear: Tympanic membrane and external ear normal. Swelling (canal) and tenderness (canal) present. No drainage.     Left Ear: Tympanic membrane, ear canal and external ear  normal.     Nose: Rhinorrhea present.     Mouth/Throat:     Mouth: Mucous membranes are moist.     Pharynx: Oropharynx is clear. No oropharyngeal exudate or posterior oropharyngeal erythema.  Eyes:     General:        Right eye: No discharge.        Left eye: No discharge.     Extraocular Movements: Extraocular movements intact.     Conjunctiva/sclera: Conjunctivae normal.     Pupils: Pupils are equal, round, and reactive to light.  Cardiovascular:     Rate and Rhythm: Normal rate and regular rhythm.     Heart sounds: Normal heart sounds. No murmur heard. Pulmonary:     Effort: Pulmonary effort is normal. No respiratory distress.     Breath sounds: Normal breath sounds. No wheezing, rhonchi or rales.  Musculoskeletal:     Cervical back: Neck supple. No rigidity.     Right lower leg: No edema.     Left lower leg: No edema.  Skin:    General: Skin is warm and dry.     Findings: Rash  (dry patches to abdomen.) present.  Neurological:     General: No focal deficit present.     Mental Status: She is alert and oriented to person, place, and time.  Psychiatric:        Mood and Affect: Mood normal.        Behavior: Behavior normal.        Thought Content: Thought content normal.        Judgment: Judgment normal.      No results found for any visits on 02/09/24.    The 10-year ASCVD risk score (Arnett DK, et al., 2019) is: 4.3%    Assessment & Plan:   Barbara Barber was seen today for medical management of chronic issues.  Diagnoses and all orders for this visit:  Type 2 diabetes mellitus with hyperglycemia, without long-term current use of insulin (HCC) A1c 6.8 today, at goal of <7. Declined ACE/ARB. Statin intolerant.  Diet and exercise.  -     CBC with Differential/Platelet -     CMP14+EGFR -     Vitamin B12 -     Bayer DCA Hb A1c Waived -     metFORMIN  (GLUCOPHAGE ) 1000 MG tablet; Take 1 tablet (1,000 mg total) by mouth 2 (two) times daily with a meal. -     Continuous Glucose Sensor (FREESTYLE LIBRE 3 PLUS SENSOR) MISC; Change sensor every 15 days.  Hyperlipidemia associated with type 2 diabetes mellitus (HCC) Last LDL 116. Continue zetia . Statin intolerant. Diet, exercise, weight loss.  -     ezetimibe  (ZETIA ) 10 MG tablet; Take 1 tablet (10 mg total) by mouth daily.  Statin intolerance -     ezetimibe  (ZETIA ) 10 MG tablet; Take 1 tablet (10 mg total) by mouth daily.  Class 1 obesity due to excess calories with serious comorbidity and body mass index (BMI) of 32.0 to 32.9 in adult Weight trending down. Diet, exercise, weight loss.   Chronic obstructive pulmonary disease, unspecified COPD type (HCC) Well controlled on current regimen.  -     albuterol  (PROVENTIL ) (2.5 MG/3ML) 0.083% nebulizer solution; Take 3 mLs (2.5 mg total) by nebulization every 6 (six) hours as needed for wheezing or shortness of breath. -     albuterol  (VENTOLIN  HFA) 108 (90 Base)  MCG/ACT inhaler; Inhale 2 puffs into the lungs every 6 (six) hours as needed for wheezing  or shortness of breath. -     BREO ELLIPTA  100-25 MCG/ACT AEPB; Inhale 1 puff by mouth once daily  Chronic bilateral low back pain with bilateral sciatica Continue flexeril  prn.  -     cyclobenzaprine  (FLEXERIL ) 5 MG tablet; Take 1 tablet (5 mg total) by mouth 3 (three) times daily as needed. for muscle spams  Depression, recurrent (HCC) Anxiety Well controlled on current regimen.  -     desvenlafaxine  (PRISTIQ ) 50 MG 24 hr tablet; Take 1 tablet (50 mg total) by mouth daily.  Psychophysiological insomnia Well controlled on current regimen.  -     traZODone  (DESYREL ) 50 MG tablet; Take 0.5-1 tablets (25-50 mg total) by mouth at bedtime as needed for sleep.  Acute diffuse otitis externa of right ear Ofloxacin  as below. Discussed prevention.  -     ofloxacin  (FLOXIN ) 0.3 % OTIC solution; Place 10 drops into the right ear daily for 7 days.  Allergic rhinitis, unspecified seasonality, unspecified trigger Xyzal  as below.  -     levocetirizine (XYZAL ) 5 MG tablet; Take 1 tablet (5 mg total) by mouth every evening.  Rash Kenalgo as below for ezema.  -     triamcinolone  cream (KENALOG ) 0.1 %; Apply 1 Application topically 2 (two) times daily.  Return in about 3 months (around 05/11/2024) for chronic follow up.  The patient indicates understanding of these issues and agrees with the plan.   Annabella CHRISTELLA Search, FNP

## 2024-02-10 LAB — CMP14+EGFR
ALT: 20 IU/L (ref 0–32)
AST: 11 IU/L (ref 0–40)
Albumin: 4.2 g/dL (ref 3.8–4.9)
Alkaline Phosphatase: 74 IU/L (ref 44–121)
BUN/Creatinine Ratio: 15 (ref 9–23)
BUN: 11 mg/dL (ref 6–24)
Bilirubin Total: 0.2 mg/dL (ref 0.0–1.2)
CO2: 19 mmol/L — ABNORMAL LOW (ref 20–29)
Calcium: 9.1 mg/dL (ref 8.7–10.2)
Chloride: 105 mmol/L (ref 96–106)
Creatinine, Ser: 0.71 mg/dL (ref 0.57–1.00)
Globulin, Total: 2.3 g/dL (ref 1.5–4.5)
Glucose: 171 mg/dL — ABNORMAL HIGH (ref 70–99)
Potassium: 4.8 mmol/L (ref 3.5–5.2)
Sodium: 141 mmol/L (ref 134–144)
Total Protein: 6.5 g/dL (ref 6.0–8.5)
eGFR: 102 mL/min/1.73 (ref >59)

## 2024-02-10 LAB — CBC WITH DIFFERENTIAL/PLATELET
Basophils Absolute: 0 x10E3/uL (ref 0.0–0.2)
Basos: 1 %
EOS (ABSOLUTE): 0.3 x10E3/uL (ref 0.0–0.4)
Eos: 5 %
Hematocrit: 37.7 % (ref 34.0–46.6)
Hemoglobin: 12.2 g/dL (ref 11.1–15.9)
Immature Grans (Abs): 0 x10E3/uL (ref 0.0–0.1)
Immature Granulocytes: 0 %
Lymphocytes Absolute: 1.7 x10E3/uL (ref 0.7–3.1)
Lymphs: 28 %
MCH: 29.8 pg (ref 26.6–33.0)
MCHC: 32.4 g/dL (ref 31.5–35.7)
MCV: 92 fL (ref 79–97)
Monocytes Absolute: 0.3 x10E3/uL (ref 0.1–0.9)
Monocytes: 5 %
Neutrophils Absolute: 3.7 x10E3/uL (ref 1.4–7.0)
Neutrophils: 61 %
Platelets: 239 x10E3/uL (ref 150–450)
RBC: 4.1 x10E6/uL (ref 3.77–5.28)
RDW: 13.4 % (ref 11.7–15.4)
WBC: 6 x10E3/uL (ref 3.4–10.8)

## 2024-02-10 LAB — VITAMIN B12: Vitamin B-12: 282 pg/mL (ref 232–1245)

## 2024-02-11 ENCOUNTER — Ambulatory Visit: Payer: Self-pay | Admitting: Family Medicine

## 2024-02-13 ENCOUNTER — Encounter: Payer: Self-pay | Admitting: Family Medicine

## 2024-02-17 ENCOUNTER — Encounter: Payer: Self-pay | Admitting: Family Medicine

## 2024-05-13 ENCOUNTER — Ambulatory Visit: Admitting: Family Medicine

## 2024-05-13 VITALS — BP 102/64 | HR 75 | Temp 97.9°F | Ht 64.5 in | Wt 193.4 lb

## 2024-05-13 DIAGNOSIS — K8020 Calculus of gallbladder without cholecystitis without obstruction: Secondary | ICD-10-CM

## 2024-05-13 DIAGNOSIS — Z6832 Body mass index (BMI) 32.0-32.9, adult: Secondary | ICD-10-CM

## 2024-05-13 DIAGNOSIS — E1165 Type 2 diabetes mellitus with hyperglycemia: Secondary | ICD-10-CM | POA: Diagnosis not present

## 2024-05-13 DIAGNOSIS — E1169 Type 2 diabetes mellitus with other specified complication: Secondary | ICD-10-CM

## 2024-05-13 DIAGNOSIS — Z789 Other specified health status: Secondary | ICD-10-CM

## 2024-05-13 DIAGNOSIS — E66811 Obesity, class 1: Secondary | ICD-10-CM

## 2024-05-13 DIAGNOSIS — F419 Anxiety disorder, unspecified: Secondary | ICD-10-CM

## 2024-05-13 DIAGNOSIS — J449 Chronic obstructive pulmonary disease, unspecified: Secondary | ICD-10-CM | POA: Diagnosis not present

## 2024-05-13 DIAGNOSIS — F339 Major depressive disorder, recurrent, unspecified: Secondary | ICD-10-CM

## 2024-05-13 DIAGNOSIS — E785 Hyperlipidemia, unspecified: Secondary | ICD-10-CM

## 2024-05-13 DIAGNOSIS — E6609 Other obesity due to excess calories: Secondary | ICD-10-CM

## 2024-05-13 DIAGNOSIS — R1011 Right upper quadrant pain: Secondary | ICD-10-CM

## 2024-05-13 DIAGNOSIS — Z7984 Long term (current) use of oral hypoglycemic drugs: Secondary | ICD-10-CM

## 2024-05-13 LAB — BAYER DCA HB A1C WAIVED: HB A1C (BAYER DCA - WAIVED): 6.6 % — ABNORMAL HIGH (ref 4.8–5.6)

## 2024-05-13 MED ORDER — DESVENLAFAXINE SUCCINATE ER 100 MG PO TB24: 100.0000 mg | ORAL_TABLET | Freq: Every day | ORAL | 3 refills | Status: AC

## 2024-05-13 NOTE — Progress Notes (Signed)
 Established Patient Office Visit  Subjective   Patient ID: Barbara Barber, female    DOB: [birth year]  Age: 53 y.o. MRN: [id number]  Chief Complaint  Patient presents with   Medical Management of Chronic Issues    HPI  History of Present Illness   VANNIE HILGERT is a 53 year old female with diabetes and anxiety who presents for a follow-up visit.  Glycemic control - Diabetes managed with metformin  1000 mg twice daily and Trulicity  3 mg weekly - No self-monitoring of blood glucose levels - No symptoms of hyperglycemia or hypoglycemia - Feels well regarding glycemic control - Tolerates current medications without adverse effects - Regular diet, no exercise  Mood disturbance and anxiety - Experiences anxiety and persistent fatigue - Difficulty finding joy in previously enjoyable activities, such as decorating for Halloween - Since late September, has felt in a 'rut' with emotional distancing from husband and feeling unsupported at work - Currently on Pristiq  50 mg daily; has not tried higher dosages - History of using Cymbalta  and Lyrica  for fibromyalgia  Gastrointestinal symptoms and oral health - Nausea and right upper quadrant abdominal pain after eating - Frequent bowel movements following meals - Hx of gallstones noted on CT in March on 2025  Respiratory symptoms - History of COPD with improvement since quitting smoking in May - No tobacco use since May - Overall respiratory status improved - Decreased use of inhaler; has not required nebulizer          05/13/2024    3:13 PM 02/09/2024   10:42 AM 07/30/2023    4:01 PM  Depression screen PHQ 2/9  Decreased Interest 2 0 0  Down, Depressed, Hopeless 1 0 0  PHQ - 2 Score 3 0 0  Altered sleeping 0 0 2  Tired, decreased energy 2 1 1   Change in appetite 1 1 0  Feeling bad or failure about yourself  0 0 0  Trouble concentrating 0 0 0  Moving slowly or fidgety/restless 0 0 0  Suicidal thoughts 0 0 0  PHQ-9  Score 6 2 3   Difficult doing work/chores Somewhat difficult Not difficult at all Not difficult at all      02/09/2024   10:42 AM 07/30/2023    4:01 PM 01/30/2023   10:43 AM 11/27/2022    3:58 PM  GAD 7 : Generalized Anxiety Score  Nervous, Anxious, on Edge 0 0 0 1  Control/stop worrying 0 0 0 1  Worry too much - different things 0 0 0 2  Trouble relaxing 1 1 3 2   Restless 0 0 1 1  Easily annoyed or irritable 1 0 0 2  Afraid - awful might happen 0 0 0 0  Total GAD 7 Score 2 1 4 9   Anxiety Difficulty Not difficult at all Not difficult at all Somewhat difficult Somewhat difficult       ROS As per HPI.    Objective:     BP 102/64   Pulse 75   Temp 97.9 F (36.6 C) (Temporal)   Ht 5' 4.5 (1.638 m)   Wt 193 lb 6.4 oz (87.7 kg)   SpO2 99%   BMI 32.68 kg/m  Wt Readings from Last 3 Encounters:  05/13/24 193 lb 6.4 oz (87.7 kg)  02/09/24 194 lb 12.8 oz (88.4 kg)  07/30/23 196 lb 12.8 oz (89.3 kg)      Physical Exam Vitals and nursing note reviewed.  Constitutional:      General: She  is not in acute distress.    Appearance: Normal appearance. She is not ill-appearing, toxic-appearing or diaphoretic.  HENT:     Right Ear: Tympanic membrane, ear canal and external ear normal.     Left Ear: Tympanic membrane, ear canal and external ear normal.     Nose: Nose normal.  Eyes:     General: No scleral icterus. Cardiovascular:     Rate and Rhythm: Normal rate and regular rhythm.     Pulses: Normal pulses.     Heart sounds: Normal heart sounds. No murmur heard. Pulmonary:     Effort: Pulmonary effort is normal. No respiratory distress.     Breath sounds: Normal breath sounds.  Abdominal:     General: Bowel sounds are normal. There is no distension.     Palpations: Abdomen is soft. There is no mass.     Tenderness: There is no abdominal tenderness. There is no guarding or rebound.  Musculoskeletal:     Cervical back: Neck supple. No tenderness.     Right lower leg: No  edema.     Left lower leg: No edema.  Lymphadenopathy:     Cervical: No cervical adenopathy.  Skin:    General: Skin is warm and dry.     Coloration: Skin is not jaundiced.  Neurological:     General: No focal deficit present.     Mental Status: She is alert and oriented to person, place, and time.  Psychiatric:        Mood and Affect: Affect is tearful.        Behavior: Behavior normal.      No results found for any visits on 05/13/24.    The 10-year ASCVD risk score (Arnett DK, et al., 2019) is: 5.4%    Assessment & Plan:   Mystie was seen today for medical management of chronic issues.  Diagnoses and all orders for this visit:  Type 2 diabetes mellitus with hyperglycemia, without long-term current use of insulin (HCC) -     Bayer DCA Hb A1c Waived -     Microalbumin / creatinine urine ratio -     CMP14+EGFR -     CBC with Differential/Platelet  Hyperlipidemia associated with type 2 diabetes mellitus (HCC)  Statin intolerance  Class 1 obesity due to excess calories with serious comorbidity and body mass index (BMI) of 32.0 to 32.9 in adult  Chronic obstructive pulmonary disease, unspecified COPD type (HCC)  Depression, recurrent -     desvenlafaxine  (PRISTIQ ) 100 MG 24 hr tablet; Take 1 tablet (100 mg total) by mouth daily.  Anxiety -     desvenlafaxine  (PRISTIQ ) 100 MG 24 hr tablet; Take 1 tablet (100 mg total) by mouth daily.  Gallstones -     CMP14+EGFR -     CBC with Differential/Platelet -     Ambulatory referral to General Surgery  RUQ pain -     CMP14+EGFR -     CBC with Differential/Platelet -     Ambulatory referral to General Surgery   Assessment and Plan    Cholelithiasis with RUQ pain Persistent right upper quadrant pain, nausea, and diarrhea postprandially. No obstruction or jaundice. Financial concerns about surgery. - Refer to general surgery discussed and placed - Check liver enzymes and complete blood count.  Depression and  anxiety disorder Increased sadness, anhedonia, and social withdrawal. Currently on Pristiq  50 mg. Discussed increasing dosage for symptom relief. Emphasized lifestyle modifications. Denies SI.  - Increase Pristiq  to 100 mg  daily. - Encourage exercise, healthy diet, and adequate sleep. - Notify if no improvement after 4-6 weeks.  - Reassess in three months.  Type 2 diabetes mellitus On metformin  and Trulicity . A1c is pending. Declines statin or ARB. Intolerance to ACE.  - Check A1c with lab work.  Mixed hyperlipidemia On zetia . Declines statin. Diet, exercise, weight loss.   Obesity Weight trending down. Diet, exercise, weight loss.   Chronic obstructive pulmonary disease (COPD) Well controlled. Has stopped smoking.      Return in about 3 months (around 08/13/2024) for chronic follow up.   The patient indicates understanding of these issues and agrees with the plan.  Annabella CHRISTELLA Search, FNP

## 2024-05-14 ENCOUNTER — Ambulatory Visit: Payer: Self-pay | Admitting: Family Medicine

## 2024-05-14 LAB — CMP14+EGFR
ALT: 16 IU/L (ref 0–32)
AST: 15 IU/L (ref 0–40)
Albumin: 4.3 g/dL (ref 3.8–4.9)
Alkaline Phosphatase: 74 IU/L (ref 49–135)
BUN/Creatinine Ratio: 13 (ref 9–23)
BUN: 9 mg/dL (ref 6–24)
Bilirubin Total: 0.2 mg/dL (ref 0.0–1.2)
CO2: 23 mmol/L (ref 20–29)
Calcium: 9.1 mg/dL (ref 8.7–10.2)
Chloride: 99 mmol/L (ref 96–106)
Creatinine, Ser: 0.67 mg/dL (ref 0.57–1.00)
Globulin, Total: 2.8 g/dL (ref 1.5–4.5)
Glucose: 115 mg/dL — ABNORMAL HIGH (ref 70–99)
Potassium: 4.1 mmol/L (ref 3.5–5.2)
Sodium: 138 mmol/L (ref 134–144)
Total Protein: 7.1 g/dL (ref 6.0–8.5)
eGFR: 105 mL/min/1.73 (ref >59)

## 2024-05-14 LAB — MICROALBUMIN / CREATININE URINE RATIO
Creatinine, Urine: 49.3 mg/dL
Microalb/Creat Ratio: <6 mg/g{creat} (ref 0–29)
Microalbumin, Urine: <3 ug/mL

## 2024-05-14 LAB — CBC WITH DIFFERENTIAL/PLATELET
Basophils Absolute: 0.1 x10E3/uL (ref 0.0–0.2)
Basos: 1 %
EOS (ABSOLUTE): 0.4 x10E3/uL (ref 0.0–0.4)
Eos: 5 %
Hematocrit: 38.5 % (ref 34.0–46.6)
Hemoglobin: 12.7 g/dL (ref 11.1–15.9)
Immature Grans (Abs): 0 x10E3/uL (ref 0.0–0.1)
Immature Granulocytes: 0 %
Lymphocytes Absolute: 2.4 x10E3/uL (ref 0.7–3.1)
Lymphs: 30 %
MCH: 29.6 pg (ref 26.6–33.0)
MCHC: 33 g/dL (ref 31.5–35.7)
MCV: 90 fL (ref 79–97)
Monocytes Absolute: 0.5 x10E3/uL (ref 0.1–0.9)
Monocytes: 6 %
Neutrophils Absolute: 4.8 x10E3/uL (ref 1.4–7.0)
Neutrophils: 58 %
Platelets: 286 x10E3/uL (ref 150–450)
RBC: 4.29 x10E6/uL (ref 3.77–5.28)
RDW: 13.2 % (ref 11.7–15.4)
WBC: 8.1 x10E3/uL (ref 3.4–10.8)

## 2024-05-14 LAB — OPHTHALMOLOGY REPORT-SCANNED

## 2024-05-17 ENCOUNTER — Telehealth: Payer: Self-pay | Admitting: Internal Medicine

## 2024-05-17 NOTE — Telephone Encounter (Signed)
 Message sent to provider to see if using a hospital day was acceptable- provider verbalized okay.  Spoke to practice admin who okay'd adding pt to hospital day weeks of Dec 19-31 in [place].   Left a message for patient to call office/mychart message sent.

## 2024-05-17 NOTE — Telephone Encounter (Signed)
 Pt calling to schedule annual but there was no appt that fit her schedule. 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 There are TOC slots that work, but no open slots. Please advise.

## 2024-08-11 ENCOUNTER — Other Ambulatory Visit: Payer: Self-pay | Admitting: Internal Medicine

## 2024-08-13 ENCOUNTER — Ambulatory Visit: Admitting: Family Medicine

## 2024-08-13 ENCOUNTER — Encounter: Payer: Self-pay | Admitting: Family Medicine

## 2024-08-13 VITALS — BP 99/65 | HR 89 | Temp 98.3°F | Ht 64.5 in | Wt 196.8 lb

## 2024-08-13 DIAGNOSIS — E1165 Type 2 diabetes mellitus with hyperglycemia: Secondary | ICD-10-CM

## 2024-08-13 DIAGNOSIS — E1169 Type 2 diabetes mellitus with other specified complication: Secondary | ICD-10-CM

## 2024-08-13 DIAGNOSIS — Z789 Other specified health status: Secondary | ICD-10-CM

## 2024-08-13 DIAGNOSIS — E6609 Other obesity due to excess calories: Secondary | ICD-10-CM

## 2024-08-13 DIAGNOSIS — J449 Chronic obstructive pulmonary disease, unspecified: Secondary | ICD-10-CM

## 2024-08-13 DIAGNOSIS — Z9049 Acquired absence of other specified parts of digestive tract: Secondary | ICD-10-CM

## 2024-08-13 LAB — BAYER DCA HB A1C WAIVED: HB A1C (BAYER DCA - WAIVED): 7.1 % — ABNORMAL HIGH (ref 4.8–5.6)

## 2024-08-13 NOTE — Progress Notes (Unsigned)
" ° °  Established Patient Office Visit  Subjective   Patient ID: Barbara Barber, female    DOB: 1971-06-10  Age: 54 y.o. MRN: [id number]  Chief Complaint  Patient presents with   Medical Management of Chronic Issues    HPI  {History (Optional):23778}  ROS    Objective:     BP 99/65   Pulse 89   Temp 98.3 F (36.8 C) (Temporal)   Ht 5' 4.5 (1.638 m)   Wt 196 lb 12.8 oz (89.3 kg)   SpO2 96%   BMI 33.26 kg/m  Wt Readings from Last 3 Encounters:  08/13/24 196 lb 12.8 oz (89.3 kg)  05/13/24 193 lb 6.4 oz (87.7 kg)  02/09/24 194 lb 12.8 oz (88.4 kg)      Physical Exam   No results found for any visits on 08/13/24.  {Labs (Optional):23779}  The 10-year ASCVD risk score (Arnett DK, et al., 2019) is: 2.1%    Assessment & Plan:   Type 2 diabetes mellitus with hyperglycemia, without long-term current use of insulin (HCC) -     Bayer DCA Hb A1c Waived     No follow-ups on file.    Annabella CHRISTELLA Search, FNP "

## 2024-08-19 DIAGNOSIS — Z9049 Acquired absence of other specified parts of digestive tract: Secondary | ICD-10-CM | POA: Insufficient documentation

## 2024-10-14 ENCOUNTER — Ambulatory Visit: Admitting: Internal Medicine

## 2024-10-15 ENCOUNTER — Ambulatory Visit: Admitting: Nurse Practitioner

## 2024-11-12 ENCOUNTER — Ambulatory Visit: Admitting: Family Medicine
